# Patient Record
Sex: Male | Born: 1935 | Hispanic: Yes | Marital: Married
Health system: Southern US, Community
[De-identification: ages and names within clinical notes are randomized; demographics above are authoritative.]

---

## 2006-02-15 ENCOUNTER — Emergency Department: Payer: Self-pay | Admitting: Emergency Medicine

## 2007-07-25 ENCOUNTER — Emergency Department: Payer: Self-pay | Admitting: Emergency Medicine

## 2021-03-21 ENCOUNTER — Other Ambulatory Visit: Payer: Self-pay | Admitting: Internal Medicine

## 2021-03-21 DIAGNOSIS — N184 Chronic kidney disease, stage 4 (severe): Secondary | ICD-10-CM

## 2021-03-29 ENCOUNTER — Other Ambulatory Visit: Payer: Self-pay | Admitting: Neurology

## 2021-03-29 DIAGNOSIS — F0150 Vascular dementia without behavioral disturbance: Secondary | ICD-10-CM

## 2021-04-11 ENCOUNTER — Ambulatory Visit
Admission: RE | Admit: 2021-04-11 | Discharge: 2021-04-11 | Disposition: A | Discharge status: Home / Self Care | Payer: Medicare Other | Source: Ambulatory Visit | Attending: Internal Medicine | Admitting: Internal Medicine

## 2021-04-11 ENCOUNTER — Ambulatory Visit
Admission: RE | Admit: 2021-04-11 | Discharge: 2021-04-11 | Disposition: A | Discharge status: Home / Self Care | Payer: Medicare Other | Source: Ambulatory Visit | Attending: Neurology | Admitting: Neurology

## 2021-04-11 ENCOUNTER — Other Ambulatory Visit: Payer: Self-pay

## 2021-04-11 DIAGNOSIS — F0150 Vascular dementia without behavioral disturbance: Secondary | ICD-10-CM | POA: Diagnosis present

## 2021-04-11 DIAGNOSIS — F0280 Dementia in other diseases classified elsewhere without behavioral disturbance: Secondary | ICD-10-CM | POA: Diagnosis present

## 2021-04-11 DIAGNOSIS — N184 Chronic kidney disease, stage 4 (severe): Secondary | ICD-10-CM | POA: Diagnosis present

## 2021-04-11 DIAGNOSIS — G309 Alzheimer's disease, unspecified: Secondary | ICD-10-CM | POA: Insufficient documentation

## 2021-04-12 ENCOUNTER — Ambulatory Visit (INDEPENDENT_AMBULATORY_CARE_PROVIDER_SITE_OTHER): Payer: Medicare Other | Admitting: Dermatology

## 2021-04-12 DIAGNOSIS — L578 Other skin changes due to chronic exposure to nonionizing radiation: Secondary | ICD-10-CM

## 2021-04-12 DIAGNOSIS — D1800 Hemangioma unspecified site: Secondary | ICD-10-CM | POA: Diagnosis not present

## 2021-04-12 DIAGNOSIS — L821 Other seborrheic keratosis: Secondary | ICD-10-CM

## 2021-04-12 DIAGNOSIS — L820 Inflamed seborrheic keratosis: Secondary | ICD-10-CM | POA: Diagnosis not present

## 2021-04-12 NOTE — Progress Notes (Signed)
   New Patient Visit  Subjective  Adrian Michael is a 86 y.o. male who presents for the following: skin lesions (On the neck and back that his PCP Dr. Jens Som noticed and would like checked).  The following portions of the chart were reviewed this encounter and updated as appropriate:   Allergies  Meds  Problems  Med Hx  Surg Hx  Fam Hx     Review of Systems:  No other skin or systemic complaints except as noted in HPI or Assessment and Plan.  Objective  Well appearing patient in no apparent distress; mood and affect are within normal limits.  A focused examination was performed including face, neck, back. Relevant physical exam findings are noted in the Assessment and Plan.  Objective  Back, R neck (2): Erythematous keratotic or waxy stuck-on papule or plaque.   Images      Assessment & Plan  Inflamed seborrheic keratosis (2) Back, R neck  Recommend bx if not improved at follow up appointment.  Destruction of lesion - Back, R neck Complexity: simple   Destruction method: cryotherapy   Informed consent: discussed and consent obtained   Timeout:  patient name, date of birth, surgical site, and procedure verified Lesion destroyed using liquid nitrogen: Yes   Region frozen until ice ball extended beyond lesion: Yes   Outcome: patient tolerated procedure well with no complications   Post-procedure details: wound care instructions given     Actinic Damage - chronic, secondary to cumulative UV radiation exposure/sun exposure over time - diffuse scaly erythematous macules with underlying dyspigmentation - Recommend daily broad spectrum sunscreen SPF 30+ to sun-exposed areas, reapply every 2 hours as needed.  - Recommend staying in the shade or wearing long sleeves, sun glasses (UVA+UVB protection) and wide brim hats (4-inch brim around the entire circumference of the hat). - Call for new or changing lesions.  Seborrheic Keratoses - Stuck-on, waxy, tan-brown papules and/or  plaques  - Benign-appearing - Discussed benign etiology and prognosis. - Observe - Call for any changes  Hemangiomas - Red papules - Discussed benign nature - Observe - Call for any changes  Return in about 6 weeks (around 05/24/2021) for ISK recheck.  Adrian Michael, CMA, am acting as scribe for Sarina Ser, MD .  Documentation: I have reviewed the above documentation for accuracy and completeness, and I agree with the above.  Sarina Ser, MD

## 2021-04-12 NOTE — Patient Instructions (Signed)

## 2021-04-17 ENCOUNTER — Encounter: Payer: Self-pay | Admitting: Dermatology

## 2021-05-23 ENCOUNTER — Ambulatory Visit: Payer: Medicare Other | Admitting: Dermatology

## 2021-05-31 NOTE — Progress Notes (Signed)
 Ref Provider: Dr. Fernande, Ophelia Marinell MOULD, MD PCP: Dr. Fernande, Ophelia Marinell III, MD  Assessment and Plan:   In most patients we give written parts of assessment and plan to patient under Patient Instructions/After Visit Summary. So some parts are directed to patient.  Dear Adrian Michael, It was our pleasure to participate in your care in person. We have typed up brief summary of what we discussed.  1. 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 There are some watches that will monitor heart rate daily as well   - Start Memantine  (Namenda ). Please take 5 mg by mouth at night (Please give 90 days, 3 refills) Namenda  / memantine  is a NMDA receptor antagonist, it works on a neurotransmitter called glutamate, indicated for moderate to severe dementia of Alzheimer's type but can be used for other indications as well. Most common side effects are dizziness, headache, constipation and confusion. It is started as low dose and gradually increased.  We reviewed patient's neuroimaging report and actual images with patient. MRI Brain 04/12/2021 - 1. No acute intracranial abnormality.  2. Moderate atrophy, including medial temporal lobe volume loss  which is nonspecific but can be seen with Alzheimer's disease.  3. Mild for age chronic microvascular ischemic disease.    Reviewed Vit B1, Vit D, folate  2. Low back pain  - Following with Physiatry, status post trigger point injections, ESI injection scheduled    3. Imbalance - ambulatory with walker  Referral to Physical Therapy for balance therapy - not completed, will defer on physical therapy for now as patient will be traveling to [place] in coming months   4. History of possible diabetes mellitus - on Metformin per doctors in [place] where patient was  previously receiving medical care  - No diabetic medications per primary care provider in setting of CKD and dementia   5. Chronic Kidney Disease Stage IV - eGFR 29  - Follows with Nephrology   6. Vitamin D Deficiency - supplementing with Vitamin D Repeat Vitamin D ordered per Dr. Fernande to be collected at next lab visit   Return in about 3 months (around 08/31/2021) for Memory, With Kaitlin Paich, PA-C.  Interim History date 05/31/2021   Adrian Michael is a 86 y.o. male here for treatment and evaluation of Dementia  Adrian Michael last visit was on 03/28/2021  Patient's daughter states patient's memory is stable.  Taking Aricept  5 mg nightly.  Tolerating medication well.  Daughter states she has not noticed much improvement in cognition with addition of Donepezil .   Denies falls or balance issues. Was not contacted by therapy. Would like to   Confusion. Calling girlfriedn repetiviely. No wandering episodes. Daughter managing,. Daughter brought in nursing aide in the mornigns. No wandering episodes. No behavioral changes, aggression, irritability.    Adrian Michael mentioned that he is taking his medications as prescribed.  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 Patient's  daughter began to notice changes in cognition in  2017, his wife noticed in 2000. Onset was gradual. Patient does have hearing and gait  impairment. Patient's memory is significantly hampering daily functioning. Patient has frequent feelings of boredom. Patient is no longer able to maintain conversation. Patient has  difficulty learning new information, like how to use cell phone. Patient continues to have control of bowel and bladder. Patient has behavioral changes of clinging. Patient needs assistance with ambulation, bathing, and he forgets to eat.   Patient is unable to drive or manage finances. Patient does not get confused between day and night. Patient has not had significant head trauma. Patient does not have history of ADD or ADHD. Patient does not drink alcohol or started any new medications. Patient has not had any lab work or brain scans for memory.  Patient lived in [place] since 2010s. He moved in with his daughter in March 2022 as his health has been declining. Since patient has been here, patients daughter has been helping to manage patients finances. Patient does have hearing impairment, but does not wear hearing aids consistently.   SLUMS 03/28/21 - 11/30  MRI Brain 04/12/2021 - 1. No acute intracranial abnormality.  2. Moderate atrophy, including medial temporal lobe volume loss  which is nonspecific but can be seen with Alzheimer's disease.  3. Mild for age chronic microvascular ischemic disease.    Medications: Aricept , namenda   Low back pain + component of sciatica  Follows with Physiatry Therapies: ESI, TPI   Imbalance - ambulatory with walker, referred to physical therapy (not completed as of 05/2021)  History of possible diabetes mellitus - on Metformin per doctors in [place] where patient was previously receiving medical care - HgA1c 03/13/21 - 5.5, no diabetic medications per primary care provider in setting of CKD and dementia  Chronic Kidney Disease Stage IV - eGFR 29: Follows with Nephrology Renal US  04/11/2021 - No acute findings. No hydronephrosis. Findings consistent with medical renal disease, more notable on the right where the overall renal size is decreased with diffuse renal cortical thinning and contour lobulation. Both kidneys show increased parenchymal echogenicity, greater  on the right. Small bilateral renal cysts.    Vitamin D Deficiency - supplementing with Vitamin D  Physical Exam   Vitals Vitals:   05/31/21 1333  BP: 137/57  Pulse: 60  Weight: 80.3 kg (177 lb)  Height: 170.2 cm (5' 7)  PainSc: 0-No pain   Body mass index is 27.72 kg/m.  (Some of the exam changes noted are from previous clinical observations)  General Exam Patient looks appropriate of age, well built, nourished and appropriately groomed.   Cardiovascular Exam: S1, S2 heart sounds present Carotid exam revealed no bruit Lung exam was clear to auscultation In wheelchair (05/31/2021)   Neurological Exam     Alert,     Oriented to time, place, person     Attention span and concentration seemed appropriate     Language seemed intact (naming, spontaneous speech, comprehension)     Pupils were reactive to light, extra-ocular movements are normal     Visible parts of face is symmetric     Hearing is equal bilaterally Palate, uvula and tongue movement and other oral inspection was done when necessary.      -     Muscle strength in all extremities seemed normal on observation.     Deep tendon reflexes were symmetric     Sensations were intact to light touch in all extremities  Medications: Current Outpatient Medications on File Prior to Visit  Medication Sig Dispense Refill  . allopurinoL  (ZYLOPRIM ) 100 MG tablet Take 1 tablet (100 mg total) by mouth once daily 90 tablet 1  . amLODIPine  (NORVASC ) 5 MG tablet Takes 2 in the morning and 2 at night 360 tablet 1  . cholecalciferol, vitamin D3, (VITAMIN D3) 125 mcg (5,000 unit) tablet Take 5,000 Units by mouth once daily    . donepeziL  (ARICEPT ) 5 MG tablet Take 1 tablet (5 mg total) by mouth nightly 30 tablet 5  . ginkgo biloba 40 mg Tab Take 600 mg by mouth once daily    . glucosamine/chondr su A sod (OSTEO BI-FLEX ORAL) Take by mouth once daily    . multivit-min/FA/lycopen/lutein (CENTRUM SILVER MEN ORAL) Take by mouth once  daily    . tamsulosin  (FLOMAX ) 0.4 mg capsule Take 1 capsule (0.4 mg total) by mouth once daily Take 30 minutes after same meal each day. 90 capsule 1  . UNABLE TO FIND Focus Factor Takes by mouth daily    . UNABLE TO FIND cbd oil 5% 1 drop orally by mouth daily     No current facility-administered medications on file prior to visit.    Past Medical History:  Past Medical History:  Diagnosis Date  . Anemia in chronic kidney disease 03/13/2021  . Chronic kidney disease   . Chronic kidney disease, stage III (moderate) (CMS-HCC) 03/13/2021  . Dementia (CMS-HCC)   . Esophageal reflux 03/13/2021  . Essential hypertension 03/13/2021  . Hyperlipidemia 03/13/2021  . Hypertension   . Lumbar disc disease 03/13/2021  . Secondary hyperparathyroidism, renal (CMS-HCC) 03/13/2021  . Type 2 diabetes mellitus with complication (CMS-HCC) 03/13/2021    Past Surgical History:  Past Surgical History:  Procedure Laterality Date  . APPENDECTOMY    . JOINT REPLACEMENT    . KNEE ARTHROSCOPY     Family History:  Family History  Problem Relation Age of Onset  . Alcohol abuse Brother   . Stroke Daughter   . Diabetes Son   . Hyperlipidemia (Elevated cholesterol) Son    Social History:  Social History   Socioeconomic History  . Marital status: Widowed  Tobacco Use  . Smoking status: Never Smoker  . Smokeless tobacco: Never Used  Vaping Use  . Vaping Use: Never used  Substance and Sexual Activity  . Alcohol use: Yes    Alcohol/week: 0.8 standard drinks    Types: 1 Standard drinks or equivalent per week  . Drug use: No  . Sexual activity: Yes    Partners: Female   Allergies: No Known Allergies  Review of Systems: 13 system ROS form was given to the patient to complete and I have reviewed it. The form was sent for scan to the patient's EHR. Pertinent positives and negatives are documented in the HPI, and all other systems are negative.  I personally evaluated this patient in conjunction with Dr.  Jannett Fairly, MD.  KAITLIN PAICH, PA  I personally saw the patient and performed a substantive portion of this encounter in conjunction with the Kaitlin Paich, PA-C. This note is partially written by Asberry Sequin, CMA, and Dr. Jannett Fairly has reviewed, edited, and added to the note to reflect his best personal medical judgment. Payor: MEDICARE / Plan: MEDICARE A AND B / Product Type: Medicare /  I apologize for any typographical errors that were not detected and corrected.     Dr. Jannett MARLA Fairly, MD Board Certified in Neurology and Clinical  Neurophysiology [facility]

## 2021-06-01 NOTE — Progress Notes (Signed)
 [facility] - Physiatry  PROCEDURE: 1. Bilateral S1 transforaminal epidural injections under fluoroscopic guidance. 2. Use of fluoroscopic guidance was required to ensure adequate delivery of medication into the epidural space and for proper needle placement. 3. The patient was monitored with continuous pulse oximetry throughout the procedure.   DIAGNOSIS/INDICATION FOR PROCEDURE: The patient is a pleasant 86 year old gentleman followed by [facility] for acute on chronic bilateral low back pain with radiation into the bilateral buttock.  Clinically has symptoms consistent with lumbosacral radiculitis.  Element of facet mediated pain cannot be ruled out.  MRI of the lumbar spine reviewed on films from [place] report unavailable.  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  His pain is rated 7/10  Procedures:  06/01/2021: Bilateral S1 transforaminal ESI   IMPRESSION/RESULTS: Patient tolerated the procedure well without any complications. We used a 3.5 inch needle and 2% lidocaine.  He will follow-up with [facility] in 3 weeks.   INFORMED CONSENT: The patient understood the potential risks and benefits of the procedure, which were explained to the patient prior to the procedure.  The patient read and signed the consent stating complete understanding of the information, and wished to proceed with the procedure, there were no barriers to understanding.  Ample time was given for any questions to be answered prior to the procedure. The risks of the procedure were explained including, but not limited to the risk of bleeding and/or infection into the spinal area, intervertebral discs, or epidural space; nerve injury; nerve irritation; reaction to medications; etc.  The patient denied any history of bleeding disorders, allergies to medications used or other medical contraindications to the  procedure.  No promises were given to any expected outcome.    PROCEDURE: A time out was performed by physician and assistant.  Correct patient, procedure, plan of care, site, position, and equipment were verified.  Identical procedural technique was used for the bilateral S1 transforaminal epidural steroid injections.  The patient was sterilely prepped with a triple scrub of betadine solution and draped in the prone position.  Careful attention was paid to aseptic technique throughout the procedure.  The bilateral S1 neuroforamina were identified under fluoroscopic guidance.  The overlying skin and subcutaneous tissues were anesthetized with approximately 2-3 cc of 1% Xylocaine.  A 25 gauge spinal needle was inserted down through the foramina and into the epidural space.  Approximately 1-2 cc of Omnipaque-240 mgI/mL contrast was infiltrated under real-time fluoroscopy demonstrating satisfactory spread along the S1 nerves and tracking into the epidural space without vascular uptake.  Spot films were taken.  A solution containing 1.0 cc of dexamethasone sodium phosphate 10 mg/cc and 1.0 cc of 2% preservative-free lidocaine was equally divided and slowly infiltrated around the nerve roots and into the epidural space under real-time fluoroscopy for each S1 transforaminal epidural steroid injection.  There was good contrast washout for both epidural injections.  The needle was removed.  No aspiration was noted during the procedure despite frequent, intermittent attempts at aspiration.  There were no complications.   DISCHARGE SUMMARY: The patient was monitored post-injection for 30 minutes in the recovery area and remained stable without evidence of complications.  The patient was discharged with discharge instructions in stable condition. Patient was instructed to contact us  with any problems.  Procedure fluoro time: 55 seconds. Fluoroscopy dose: 17.51 mGy.  This is below the dose threshold #1 (5000  mGy).    BENJAMIN CHARLES CHASNIS, DO

## 2021-11-24 IMAGING — US US RENAL
1 series · 14 of 25 positions shown · non-contrast
Comparison: None.

CLINICAL DATA: Stage 4 chronic kidney disease.

EXAM:
RENAL / URINARY TRACT ULTRASOUND COMPLETE

[Series 1: us renal · 14 of 41 slices shown]
[im 1/41]
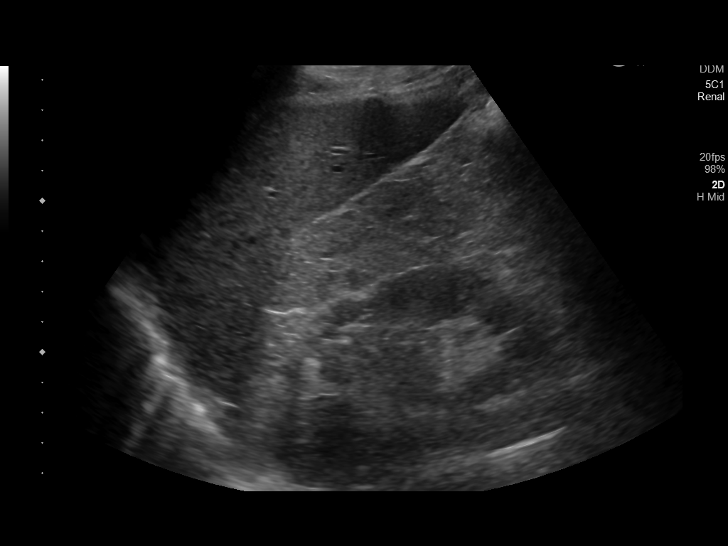
[im 4/41]
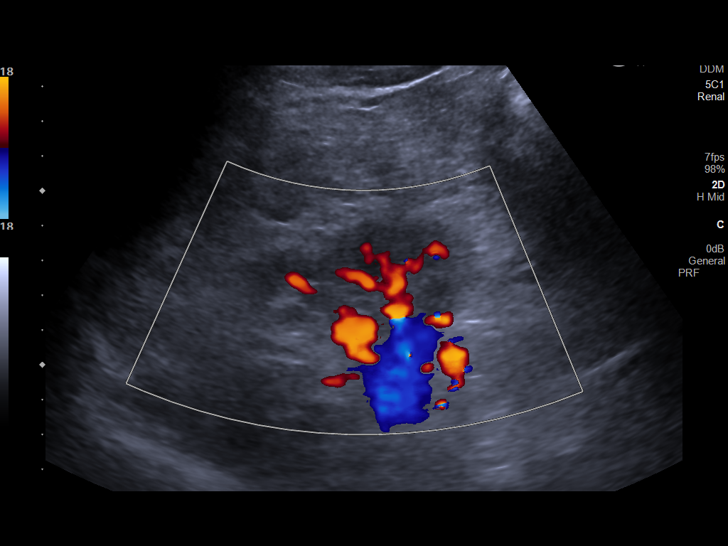
[im 7/41]
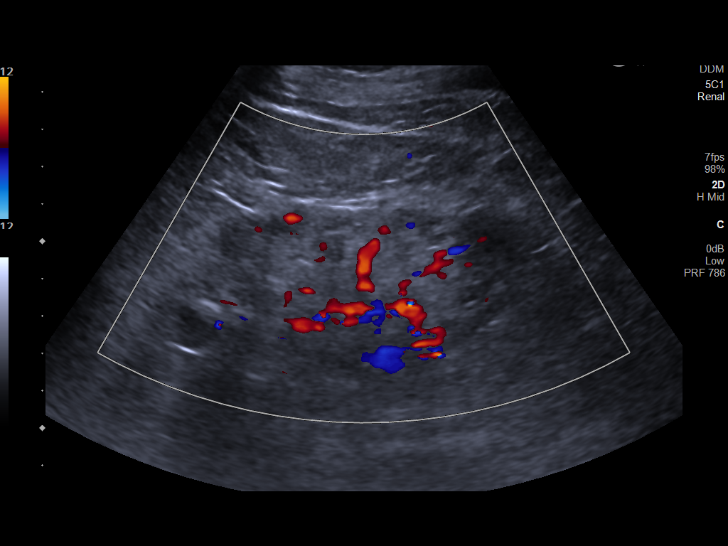
[im 11/41]
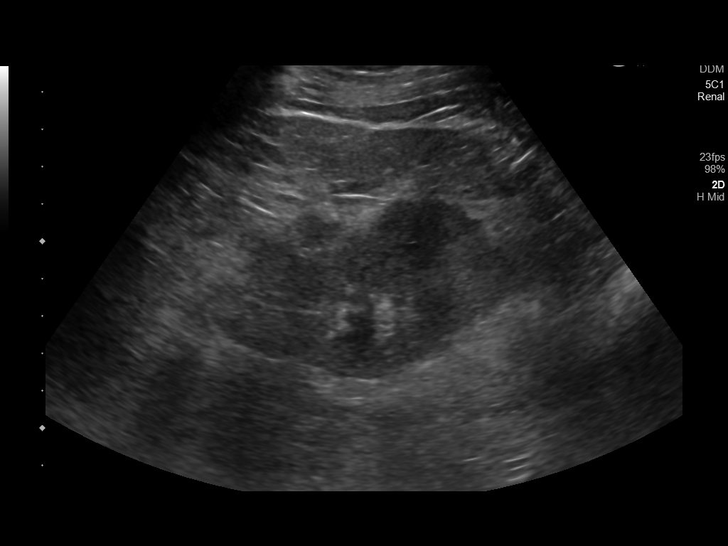
[im 14/41]
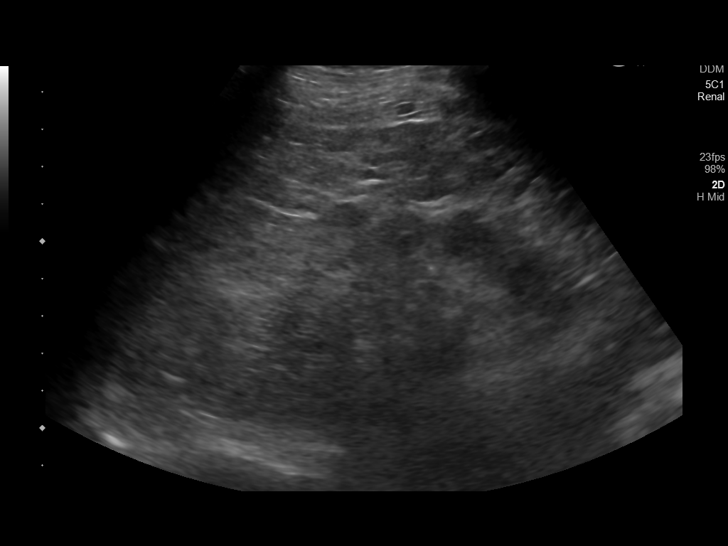
[im 16/41]
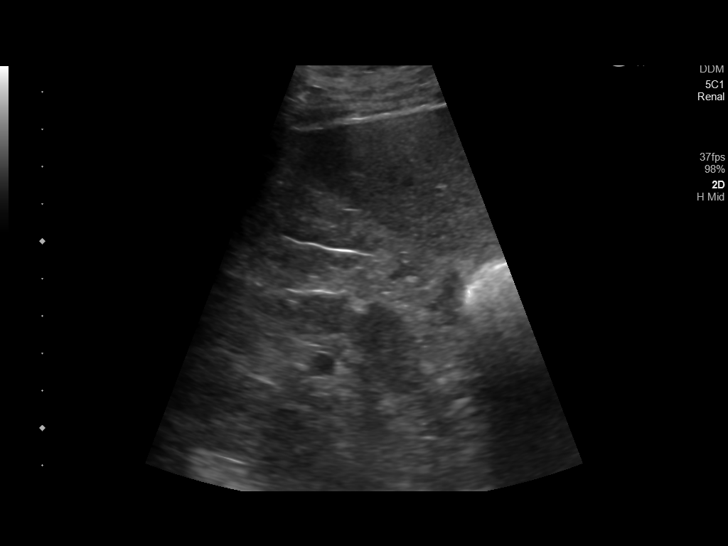
[im 19/41]
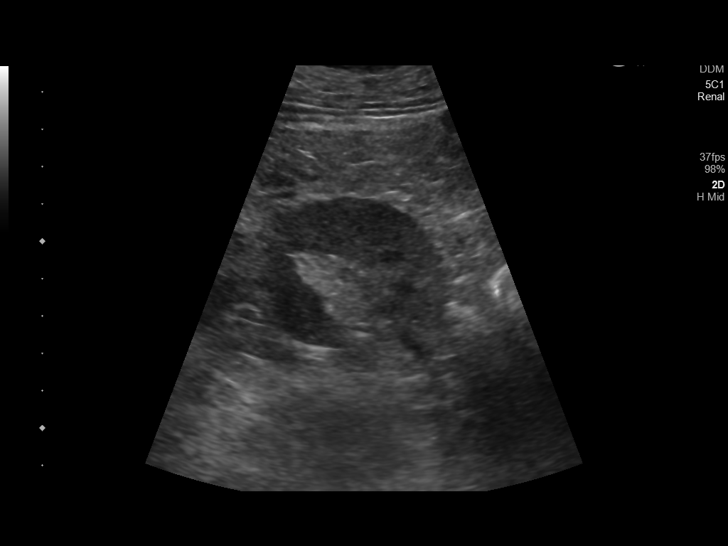
[im 22/41]
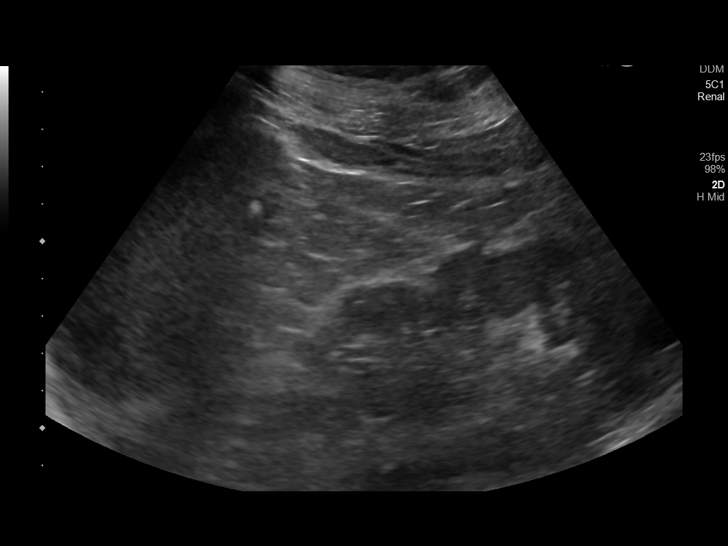
[im 26/41]
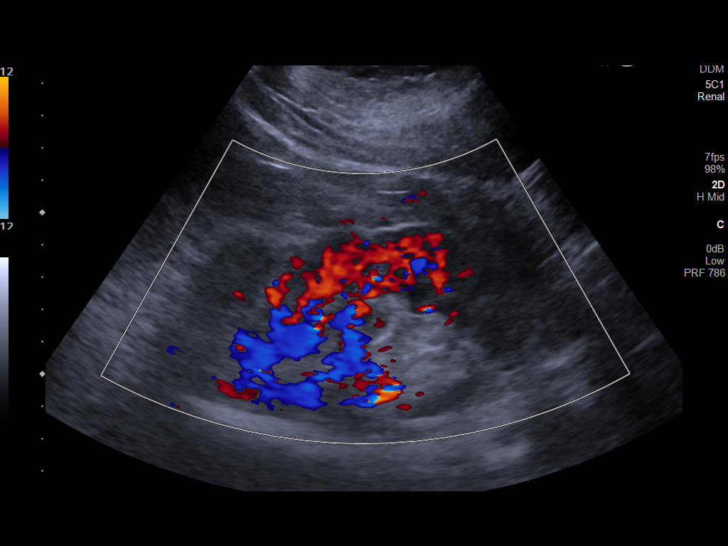
[im 27/41]
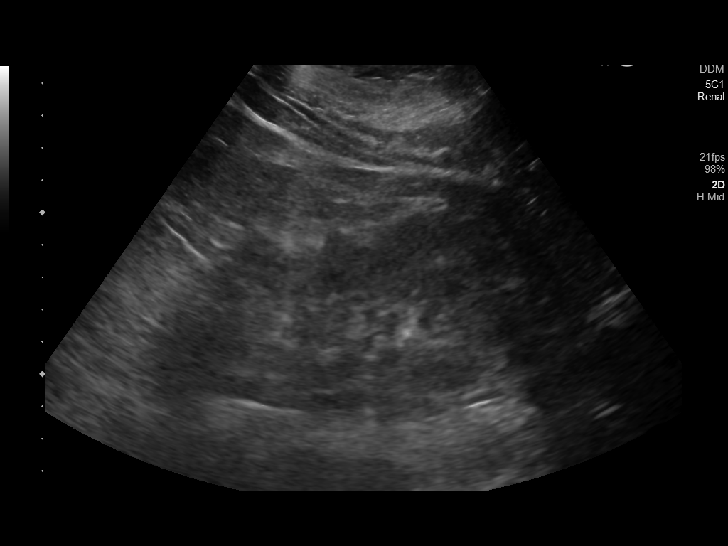
[im 31/41]
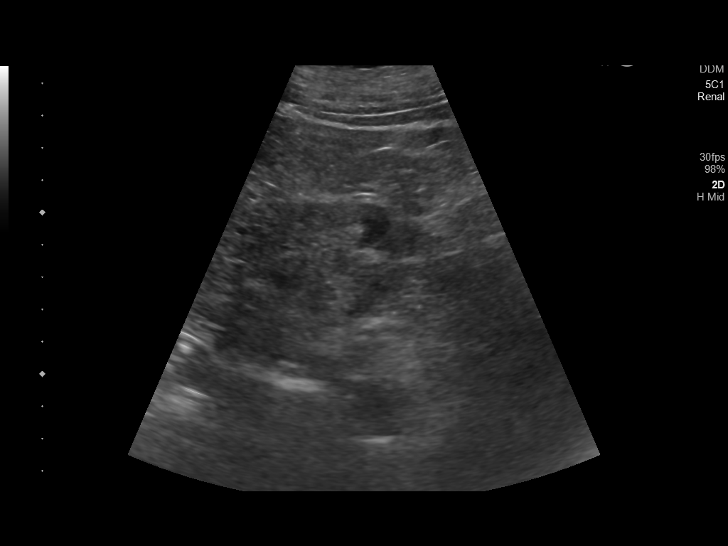
[im 34/41]
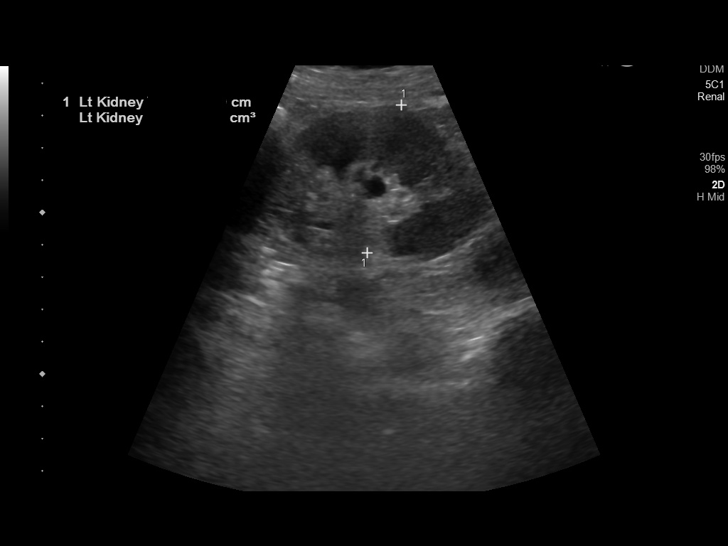
[im 37/41]
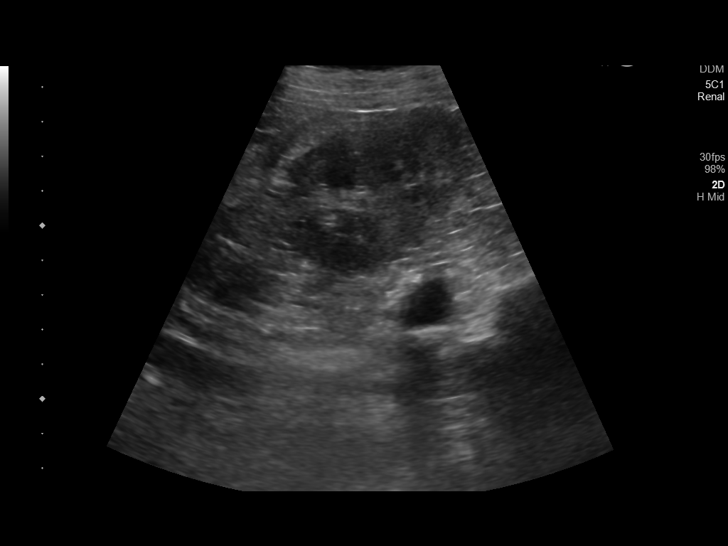
[im 41/41]
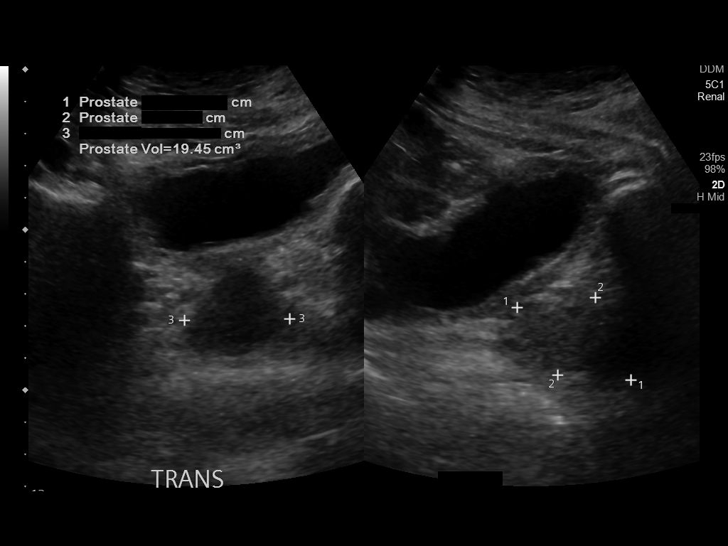

[14 of 25 positions shown; findings below may reference images not displayed]

FINDINGS: Right Kidney:

Renal measurements: 8.6 x 4.2 x 5.0 cm = volume: 94.3 mL. Increased
parenchymal echogenicity. Cortical thinning and lobulated contour.
Upper pole cyst measuring 1.1 x 1.0 x 1.0 cm. No other masses, no
stones and no hydronephrosis.

Left Kidney:

Renal measurements: 11.9 x 6.0 x 4.7 cm = volume: 177.0 mL. Mild
upper pole renal cortical thinning with contour lobulation. Mild
increased parenchymal echogenicity less notable than that on the
right. Small cyst arises from the upper pole, 1.5 x 0.8 x 1.3 cm. No
other masses, no stones and no hydronephrosis.

Bladder:

Appears normal for degree of bladder distention.

Other:

Prostate volume, 19.5 mL.
IMPRESSION: 1. No acute findings.  No hydronephrosis.
2. Findings consistent with medical renal disease, more notable on
the right where the overall renal size is decreased with diffuse
renal cortical thinning and contour lobulation. Both kidneys show
increased parenchymal echogenicity, greater on the right.
3. Small bilateral renal cysts.

## 2021-11-24 IMAGING — MR MR HEAD W/O CM
11 series · 48 of 48 positions shown · non-contrast
Comparison: None.

CLINICAL DATA: Increasing confusion.

EXAM:
MRI HEAD WITHOUT CONTRAST
TECHNIQUE: Multiplanar, multiecho pulse sequences of the brain and surrounding
structures were obtained without intravenous contrast.

[Series 5: ax dwi_tracew · axial · 3.0mm · 0.65mm/px · z∈[-69,+82]mm · 4 of 48 slices shown]
[im 1/48]
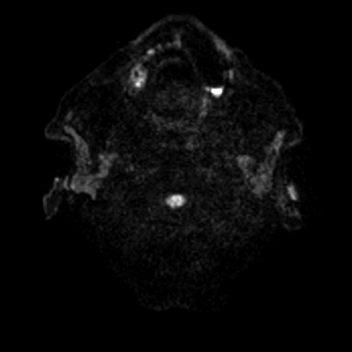
[im 16/48]
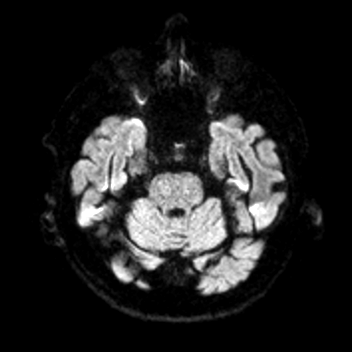
[im 32/48]
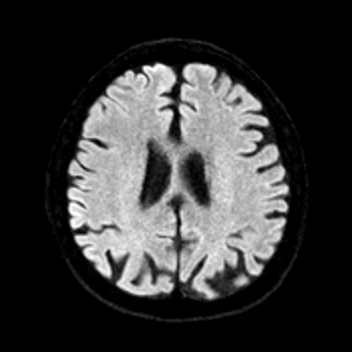
[im 48/48]
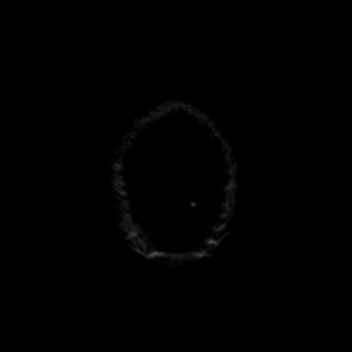

[Series 6: ax dwi_adc · axial · 3.0mm · 0.65mm/px · z∈[-69,+82]mm · 4 of 48 slices shown]
[im 1/48]
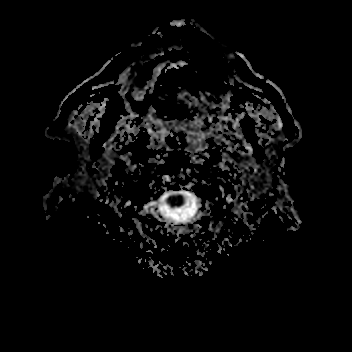
[im 16/48]
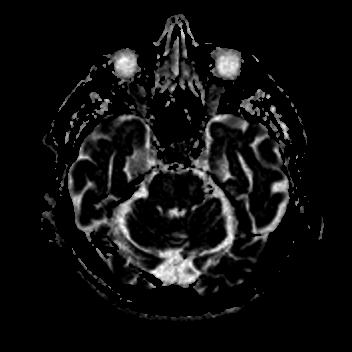
[im 32/48]
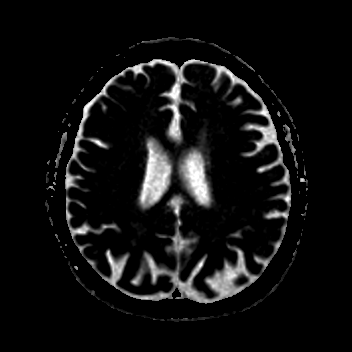
[im 48/48]
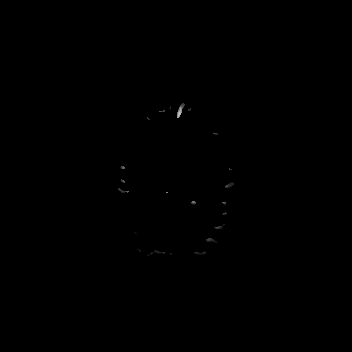

[Series 7: cor dwi_tracew · coronal · 5.0mm · 0.65mm/px · 3 of 40 slices shown]
[im 1/40]
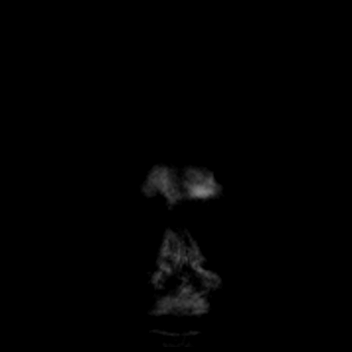
[im 20/40]
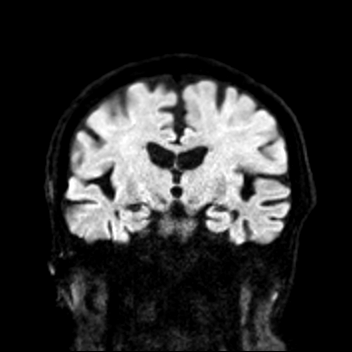
[im 40/40]
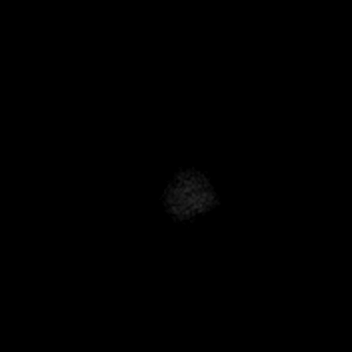

[Series 8: cor dwi_adc · coronal · 5.0mm · 0.65mm/px · 3 of 39 slices shown]
[im 1/39]
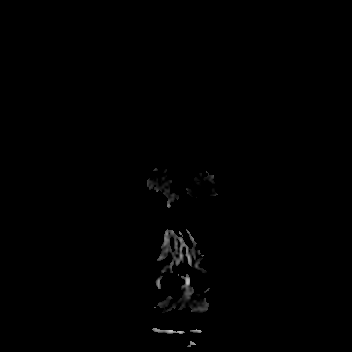
[im 20/39]
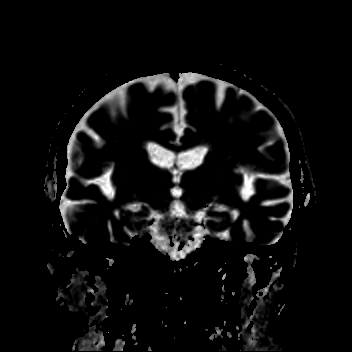
[im 39/39]
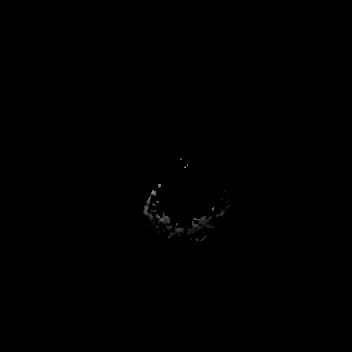

[Series 9: T1 · sagittal · 5.0mm · 0.62mm/px · 2 of 25 slices shown (1 of 2)]
[im 1/25]
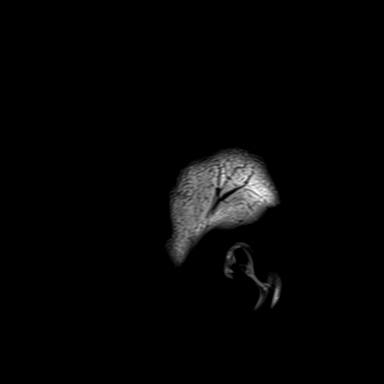
[im 25/25]
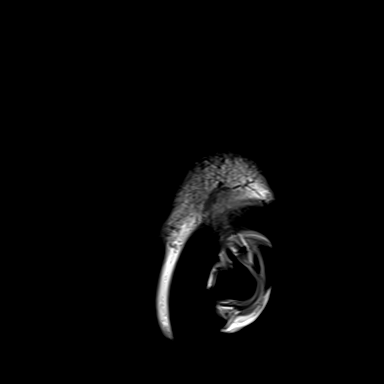

[Series 10: T2 · axial · 5.0mm · 0.53mm/px · z∈[-62,+79]mm · 2 of 25 slices shown (1 of 2)]
[im 1/25]
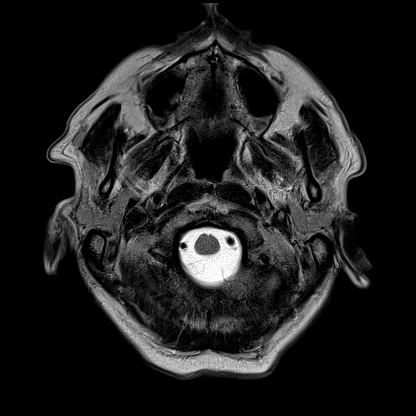
[im 25/25]
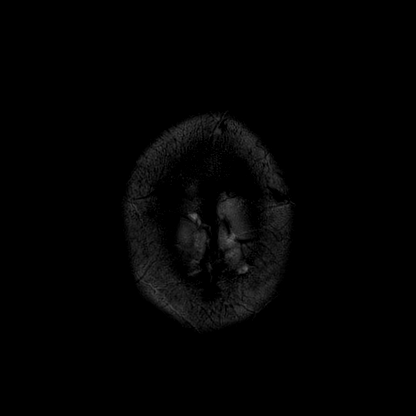

[Series 12: pha_images · axial · 3.0mm · 0.90mm/px · z∈[-76,+91]mm · 5 of 57 slices shown]
[im 1/57]
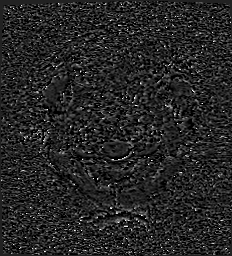
[im 15/57]
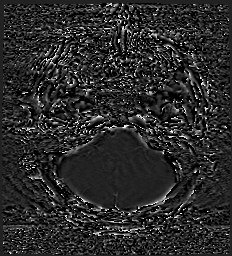
[im 29/57]
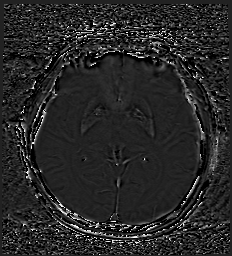
[im 43/57]
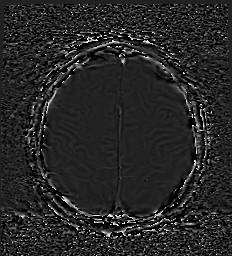
[im 57/57]
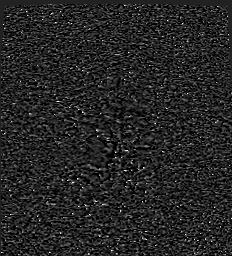

[Series 13: swi_images · axial · 3.0mm · 0.90mm/px · z∈[-79,+94]mm · 5 of 60 slices shown]
[im 1/60]
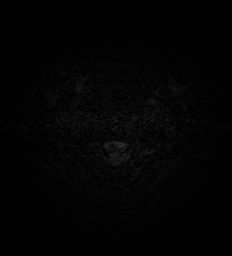
[im 15/60]
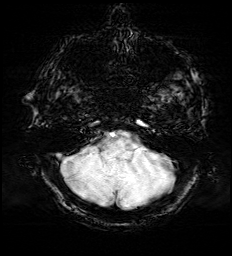
[im 30/60]
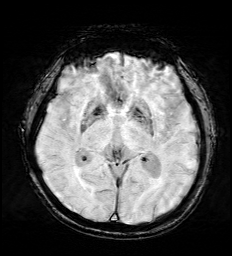
[im 45/60]
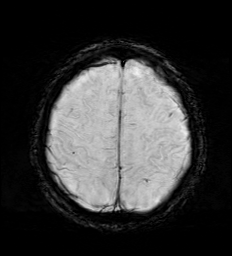
[im 60/60]
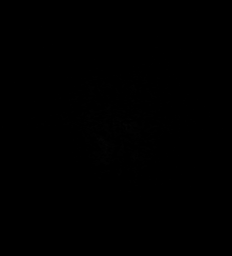

[Series 15: FLAIR · axial · 3.0mm · 0.53mm/px · z∈[-71,+88]mm · 4 of 55 slices shown]
[im 1/55]
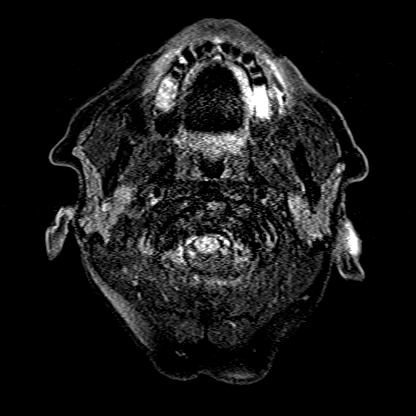
[im 19/55]
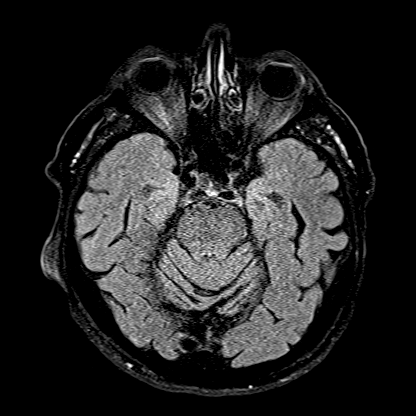
[im 37/55]
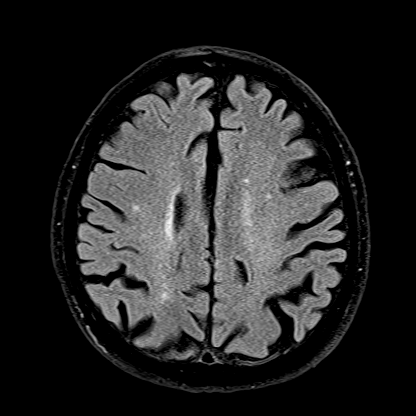
[im 55/55]
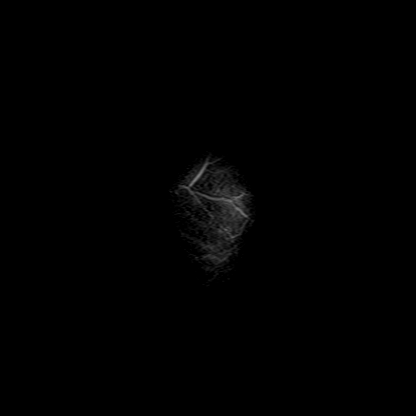

[Series 16: T1 · axial · 1.0mm · 0.98mm/px · z∈[-75,+97]mm · 14 of 176 slices shown (2 of 2)]
[im 1/176]
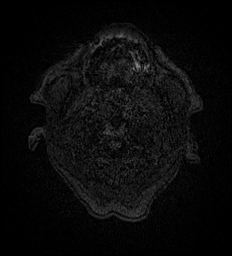
[im 14/176]
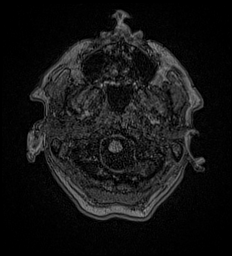
[im 27/176]
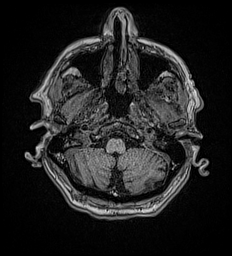
[im 41/176]
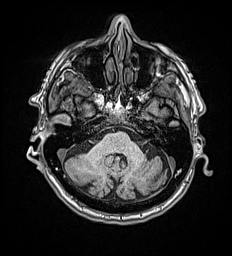
[im 54/176]
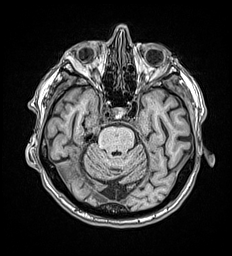
[im 68/176]
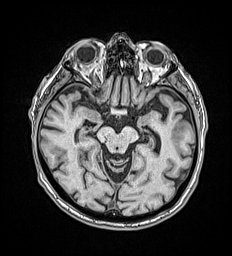
[im 81/176]
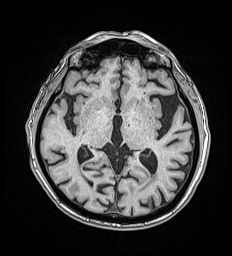
[im 95/176]
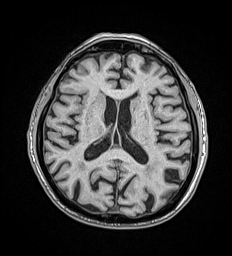
[im 108/176]
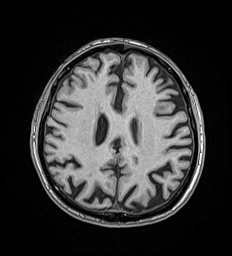
[im 122/176]
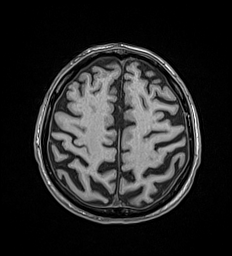
[im 135/176]
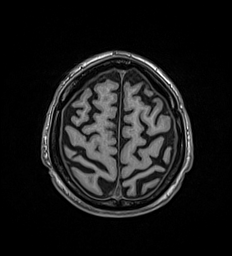
[im 149/176]
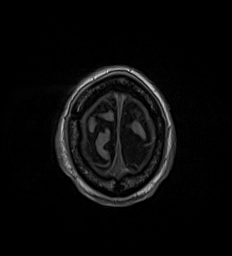
[im 162/176]
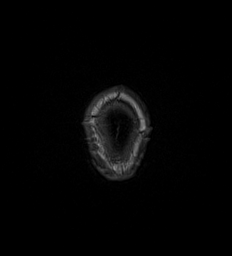
[im 176/176]
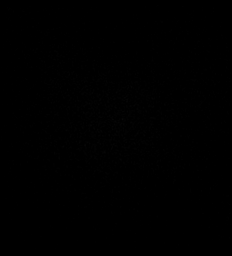

[Series 17: T2 · coronal · 5.0mm · 0.57mm/px · 2 of 29 slices shown (2 of 2)]
[im 1/29]
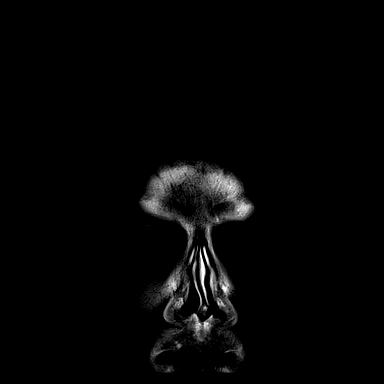
[im 29/29]
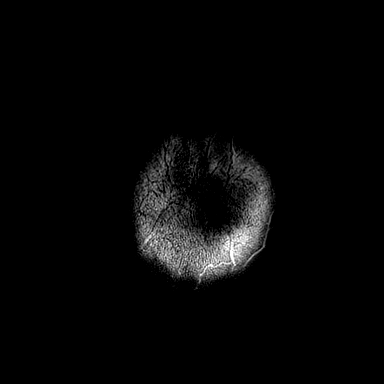

[48 of 48 positions shown; findings below may reference images not displayed]

FINDINGS: Brain: No acute infarction, hemorrhage, hydrocephalus, extra-axial
collection or mass lesion. Moderate cerebral atrophy, including
mesial temporal lobe volume loss and ex vacuo ventricular dilation.
Mild for age scattered T2/FLAIR hyperintensities within the white
matter, compatible with chronic microvascular ischemic disease.

Vascular: Major arterial flow voids are maintained at the skull
base.

Skull and upper cervical spine: Normal marrow signal.

Sinuses/Orbits: Clear sinuses.  Unremarkable orbits.

Other: No mastoid effusions.
IMPRESSION: 1. No acute intracranial abnormality.
2. Moderate atrophy, including medial temporal lobe volume loss
which is nonspecific but can be seen with Alzheimer's disease.
3. Mild for age chronic microvascular ischemic disease.

## 2021-12-26 NOTE — Telephone Encounter (Signed)
Lvm for pt to return call to schedule f/u appt.

## 2023-05-07 ENCOUNTER — Ambulatory Visit: Payer: Medicare Other

## 2023-05-14 ENCOUNTER — Ambulatory Visit: Payer: Medicare Other

## 2023-05-21 ENCOUNTER — Ambulatory Visit: Payer: Medicare Other

## 2023-05-28 ENCOUNTER — Ambulatory Visit: Payer: Medicare Other

## 2023-06-25 ENCOUNTER — Ambulatory Visit: Payer: Medicare Other

## 2024-08-29 ENCOUNTER — Emergency Department

## 2024-08-29 ENCOUNTER — Inpatient Hospital Stay
Admission: EM | Admit: 2024-08-29 | Discharge: 2024-09-06 | DRG: 193 | Disposition: A | Discharge status: Hospice | Attending: Internal Medicine | Admitting: Internal Medicine

## 2024-08-29 ENCOUNTER — Other Ambulatory Visit: Payer: Self-pay

## 2024-08-29 DIAGNOSIS — I499 Cardiac arrhythmia, unspecified: Secondary | ICD-10-CM

## 2024-08-29 DIAGNOSIS — E782 Mixed hyperlipidemia: Secondary | ICD-10-CM | POA: Diagnosis present

## 2024-08-29 DIAGNOSIS — Z515 Encounter for palliative care: Secondary | ICD-10-CM

## 2024-08-29 DIAGNOSIS — E1151 Type 2 diabetes mellitus with diabetic peripheral angiopathy without gangrene: Secondary | ICD-10-CM | POA: Diagnosis present

## 2024-08-29 DIAGNOSIS — G309 Alzheimer's disease, unspecified: Secondary | ICD-10-CM | POA: Diagnosis not present

## 2024-08-29 DIAGNOSIS — N179 Acute kidney failure, unspecified: Secondary | ICD-10-CM | POA: Diagnosis present

## 2024-08-29 DIAGNOSIS — I479 Paroxysmal tachycardia, unspecified: Secondary | ICD-10-CM | POA: Diagnosis not present

## 2024-08-29 DIAGNOSIS — I493 Ventricular premature depolarization: Secondary | ICD-10-CM | POA: Diagnosis not present

## 2024-08-29 DIAGNOSIS — J1001 Influenza due to other identified influenza virus with the same other identified influenza virus pneumonia: Secondary | ICD-10-CM | POA: Diagnosis present

## 2024-08-29 DIAGNOSIS — N189 Chronic kidney disease, unspecified: Secondary | ICD-10-CM | POA: Diagnosis not present

## 2024-08-29 DIAGNOSIS — I4891 Unspecified atrial fibrillation: Secondary | ICD-10-CM | POA: Diagnosis not present

## 2024-08-29 DIAGNOSIS — R7989 Other specified abnormal findings of blood chemistry: Secondary | ICD-10-CM

## 2024-08-29 DIAGNOSIS — E870 Hyperosmolality and hypernatremia: Secondary | ICD-10-CM | POA: Diagnosis not present

## 2024-08-29 DIAGNOSIS — R64 Cachexia: Secondary | ICD-10-CM | POA: Diagnosis present

## 2024-08-29 DIAGNOSIS — E871 Hypo-osmolality and hyponatremia: Secondary | ICD-10-CM | POA: Diagnosis present

## 2024-08-29 DIAGNOSIS — F0150 Vascular dementia without behavioral disturbance: Secondary | ICD-10-CM | POA: Diagnosis present

## 2024-08-29 DIAGNOSIS — I2489 Other forms of acute ischemic heart disease: Secondary | ICD-10-CM | POA: Diagnosis present

## 2024-08-29 DIAGNOSIS — J9601 Acute respiratory failure with hypoxia: Principal | ICD-10-CM | POA: Diagnosis present

## 2024-08-29 DIAGNOSIS — L89153 Pressure ulcer of sacral region, stage 3: Secondary | ICD-10-CM | POA: Diagnosis present

## 2024-08-29 DIAGNOSIS — E875 Hyperkalemia: Secondary | ICD-10-CM | POA: Diagnosis present

## 2024-08-29 DIAGNOSIS — E1122 Type 2 diabetes mellitus with diabetic chronic kidney disease: Secondary | ICD-10-CM | POA: Diagnosis present

## 2024-08-29 DIAGNOSIS — Z1152 Encounter for screening for COVID-19: Secondary | ICD-10-CM | POA: Diagnosis not present

## 2024-08-29 DIAGNOSIS — D649 Anemia, unspecified: Secondary | ICD-10-CM

## 2024-08-29 DIAGNOSIS — E43 Unspecified severe protein-calorie malnutrition: Secondary | ICD-10-CM | POA: Diagnosis present

## 2024-08-29 DIAGNOSIS — E1165 Type 2 diabetes mellitus with hyperglycemia: Secondary | ICD-10-CM | POA: Diagnosis present

## 2024-08-29 DIAGNOSIS — N400 Enlarged prostate without lower urinary tract symptoms: Secondary | ICD-10-CM | POA: Diagnosis present

## 2024-08-29 DIAGNOSIS — F0280 Dementia in other diseases classified elsewhere without behavioral disturbance: Secondary | ICD-10-CM | POA: Diagnosis present

## 2024-08-29 DIAGNOSIS — D7589 Other specified diseases of blood and blood-forming organs: Secondary | ICD-10-CM | POA: Diagnosis present

## 2024-08-29 DIAGNOSIS — I10 Essential (primary) hypertension: Secondary | ICD-10-CM | POA: Diagnosis not present

## 2024-08-29 DIAGNOSIS — I480 Paroxysmal atrial fibrillation: Secondary | ICD-10-CM | POA: Diagnosis present

## 2024-08-29 DIAGNOSIS — Z794 Long term (current) use of insulin: Secondary | ICD-10-CM

## 2024-08-29 DIAGNOSIS — E8721 Acute metabolic acidosis: Secondary | ICD-10-CM | POA: Diagnosis present

## 2024-08-29 DIAGNOSIS — Z6821 Body mass index (BMI) 21.0-21.9, adult: Secondary | ICD-10-CM

## 2024-08-29 DIAGNOSIS — Z79890 Hormone replacement therapy (postmenopausal): Secondary | ICD-10-CM

## 2024-08-29 DIAGNOSIS — I4892 Unspecified atrial flutter: Secondary | ICD-10-CM | POA: Diagnosis present

## 2024-08-29 DIAGNOSIS — I471 Supraventricular tachycardia, unspecified: Secondary | ICD-10-CM | POA: Diagnosis present

## 2024-08-29 DIAGNOSIS — J101 Influenza due to other identified influenza virus with other respiratory manifestations: Secondary | ICD-10-CM | POA: Diagnosis not present

## 2024-08-29 DIAGNOSIS — I959 Hypotension, unspecified: Secondary | ICD-10-CM | POA: Diagnosis present

## 2024-08-29 DIAGNOSIS — L8990 Pressure ulcer of unspecified site, unspecified stage: Secondary | ICD-10-CM

## 2024-08-29 DIAGNOSIS — I129 Hypertensive chronic kidney disease with stage 1 through stage 4 chronic kidney disease, or unspecified chronic kidney disease: Secondary | ICD-10-CM | POA: Diagnosis present

## 2024-08-29 DIAGNOSIS — N184 Chronic kidney disease, stage 4 (severe): Secondary | ICD-10-CM | POA: Diagnosis present

## 2024-08-29 DIAGNOSIS — E860 Dehydration: Secondary | ICD-10-CM | POA: Diagnosis present

## 2024-08-29 DIAGNOSIS — R0602 Shortness of breath: Secondary | ICD-10-CM | POA: Diagnosis not present

## 2024-08-29 DIAGNOSIS — D631 Anemia in chronic kidney disease: Secondary | ICD-10-CM | POA: Diagnosis present

## 2024-08-29 DIAGNOSIS — R000 Tachycardia, unspecified: Secondary | ICD-10-CM | POA: Diagnosis not present

## 2024-08-29 DIAGNOSIS — Z66 Do not resuscitate: Secondary | ICD-10-CM | POA: Diagnosis present

## 2024-08-29 DIAGNOSIS — E039 Hypothyroidism, unspecified: Secondary | ICD-10-CM | POA: Diagnosis present

## 2024-08-29 DIAGNOSIS — Z7984 Long term (current) use of oral hypoglycemic drugs: Secondary | ICD-10-CM

## 2024-08-29 DIAGNOSIS — J1100 Influenza due to unidentified influenza virus with unspecified type of pneumonia: Secondary | ICD-10-CM | POA: Diagnosis present

## 2024-08-29 DIAGNOSIS — Z7401 Bed confinement status: Secondary | ICD-10-CM

## 2024-08-29 DIAGNOSIS — R008 Other abnormalities of heart beat: Secondary | ICD-10-CM | POA: Diagnosis present

## 2024-08-29 DIAGNOSIS — K219 Gastro-esophageal reflux disease without esophagitis: Secondary | ICD-10-CM | POA: Diagnosis present

## 2024-08-29 DIAGNOSIS — R54 Age-related physical debility: Secondary | ICD-10-CM | POA: Diagnosis present

## 2024-08-29 DIAGNOSIS — M109 Gout, unspecified: Secondary | ICD-10-CM | POA: Diagnosis present

## 2024-08-29 DIAGNOSIS — R627 Adult failure to thrive: Secondary | ICD-10-CM | POA: Diagnosis present

## 2024-08-29 DIAGNOSIS — I2510 Atherosclerotic heart disease of native coronary artery without angina pectoris: Secondary | ICD-10-CM | POA: Diagnosis present

## 2024-08-29 DIAGNOSIS — Z79899 Other long term (current) drug therapy: Secondary | ICD-10-CM

## 2024-08-29 DIAGNOSIS — D509 Iron deficiency anemia, unspecified: Secondary | ICD-10-CM | POA: Diagnosis present

## 2024-08-29 LAB — URINALYSIS, ROUTINE W REFLEX MICROSCOPIC
Bacteria, UA: NONE SEEN
Bilirubin Urine: NEGATIVE
Glucose, UA: NEGATIVE mg/dL
Hgb urine dipstick: NEGATIVE
Ketones, ur: NEGATIVE mg/dL
Leukocytes,Ua: NEGATIVE
Nitrite: NEGATIVE
Protein, ur: 30 mg/dL — AB
Specific Gravity, Urine: 1.014 (ref 1.005–1.030)
Squamous Epithelial / HPF: 0 /HPF (ref 0–5)
pH: 5 (ref 5.0–8.0)

## 2024-08-29 LAB — CBC WITH DIFFERENTIAL/PLATELET
Abs Immature Granulocytes: 0.01 K/uL (ref 0.00–0.07)
Basophils Absolute: 0 K/uL (ref 0.0–0.1)
Basophils Relative: 0 %
Eosinophils Absolute: 0 K/uL (ref 0.0–0.5)
Eosinophils Relative: 0 %
HCT: 31.1 % — ABNORMAL LOW (ref 39.0–52.0)
Hemoglobin: 9.8 g/dL — ABNORMAL LOW (ref 13.0–17.0)
Immature Granulocytes: 0 %
Lymphocytes Relative: 22 %
Lymphs Abs: 1.3 K/uL (ref 0.7–4.0)
MCH: 33.8 pg (ref 26.0–34.0)
MCHC: 31.5 g/dL (ref 30.0–36.0)
MCV: 107.2 fL — ABNORMAL HIGH (ref 80.0–100.0)
Monocytes Absolute: 0.3 K/uL (ref 0.1–1.0)
Monocytes Relative: 6 %
Neutro Abs: 4 K/uL (ref 1.7–7.7)
Neutrophils Relative %: 72 %
Platelets: 220 K/uL (ref 150–400)
RBC: 2.9 MIL/uL — ABNORMAL LOW (ref 4.22–5.81)
RDW: 14.6 % (ref 11.5–15.5)
Smear Review: NORMAL
WBC: 5.6 K/uL (ref 4.0–10.5)
nRBC: 0 % (ref 0.0–0.2)

## 2024-08-29 LAB — BLOOD GAS, ARTERIAL
Acid-base deficit: 11 mmol/L — ABNORMAL HIGH (ref 0.0–2.0)
Bicarbonate: 13.4 mmol/L — ABNORMAL LOW (ref 20.0–28.0)
O2 Content: 10 L/min
O2 Saturation: 99.6 %
Patient temperature: 37
pCO2 arterial: 26 mmHg — ABNORMAL LOW (ref 32–48)
pH, Arterial: 7.32 — ABNORMAL LOW (ref 7.35–7.45)
pO2, Arterial: 100 mmHg (ref 83–108)

## 2024-08-29 LAB — RESP PANEL BY RT-PCR (RSV, FLU A&B, COVID)  RVPGX2
Influenza A by PCR: POSITIVE — AB
Influenza B by PCR: NEGATIVE
Resp Syncytial Virus by PCR: NEGATIVE
SARS Coronavirus 2 by RT PCR: NEGATIVE

## 2024-08-29 LAB — LACTIC ACID, PLASMA
Lactic Acid, Venous: 2 mmol/L (ref 0.5–1.9)
Lactic Acid, Venous: 1.9 mmol/L (ref 0.5–1.9)

## 2024-08-29 LAB — COMPREHENSIVE METABOLIC PANEL WITH GFR
ALT: 69 U/L — ABNORMAL HIGH (ref 0–44)
AST: 72 U/L — ABNORMAL HIGH (ref 15–41)
Albumin: 3.2 g/dL — ABNORMAL LOW (ref 3.5–5.0)
Alkaline Phosphatase: 107 U/L (ref 38–126)
Anion gap: 11 (ref 5–15)
BUN: 143 mg/dL — ABNORMAL HIGH (ref 8–23)
CO2: 13 mmol/L — ABNORMAL LOW (ref 22–32)
Calcium: 8.5 mg/dL — ABNORMAL LOW (ref 8.9–10.3)
Chloride: 117 mmol/L — ABNORMAL HIGH (ref 98–111)
Creatinine, Ser: 2.58 mg/dL — ABNORMAL HIGH (ref 0.61–1.24)
GFR, Estimated: 23 mL/min — ABNORMAL LOW (ref >60)
Glucose, Bld: 252 mg/dL — ABNORMAL HIGH (ref 70–99)
Potassium: 5.7 mmol/L — ABNORMAL HIGH (ref 3.5–5.1)
Sodium: 141 mmol/L (ref 135–145)
Total Bilirubin: 0.4 mg/dL (ref 0.0–1.2)
Total Protein: 6.4 g/dL — ABNORMAL LOW (ref 6.5–8.1)

## 2024-08-29 LAB — TROPONIN I (HIGH SENSITIVITY)
Troponin I (High Sensitivity): 322 ng/L (ref <18)
Troponin I (High Sensitivity): 328 ng/L (ref <18)
Troponin I (High Sensitivity): 331 ng/L (ref <18)

## 2024-08-29 LAB — BLOOD GAS, VENOUS: Patient temperature: 37

## 2024-08-29 LAB — PROTIME-INR
INR: 1.1 (ref 0.8–1.2)
Prothrombin Time: 14.8 s (ref 11.4–15.2)

## 2024-08-29 LAB — BRAIN NATRIURETIC PEPTIDE: B Natriuretic Peptide: 413.4 pg/mL — ABNORMAL HIGH (ref 0.0–100.0)

## 2024-08-29 LAB — HEPARIN LEVEL (UNFRACTIONATED): Heparin Unfractionated: <0.1 [IU]/mL — ABNORMAL LOW (ref 0.30–0.70)

## 2024-08-29 LAB — APTT: aPTT: 37 s — ABNORMAL HIGH (ref 24–36)

## 2024-08-29 MED ORDER — OSELTAMIVIR PHOSPHATE 75 MG PO CAPS
75.0000 mg | ORAL_CAPSULE | Freq: Two times a day (BID) | ORAL | Status: DC
  Administered 2024-08-29 – 2024-08-30 (×2): 75 mg via ORAL
  Filled 2024-08-29 (×3): qty 1

## 2024-08-29 MED ORDER — MAGNESIUM HYDROXIDE 400 MG/5ML PO SUSP: 30.0000 mL | Freq: Every day | ORAL | Status: DC | PRN

## 2024-08-29 MED ORDER — ONDANSETRON HCL 4 MG/2ML IJ SOLN: 4.0000 mg | Freq: Four times a day (QID) | INTRAMUSCULAR | Status: DC | PRN

## 2024-08-29 MED ORDER — HYDROCOD POLI-CHLORPHE POLI ER 10-8 MG/5ML PO SUER: 5.0000 mL | Freq: Two times a day (BID) | ORAL | Status: DC | PRN

## 2024-08-29 MED ORDER — SODIUM CHLORIDE 0.9 % IV SOLN
2.0000 g | Freq: Once | INTRAVENOUS | Status: AC
  Administered 2024-08-29: 2 g via INTRAVENOUS
  Filled 2024-08-29: qty 20

## 2024-08-29 MED ORDER — SODIUM ZIRCONIUM CYCLOSILICATE 10 G PO PACK
10.0000 g | PACK | Freq: Once | ORAL | Status: DC
  Filled 2024-08-29: qty 1

## 2024-08-29 MED ORDER — SODIUM CHLORIDE 0.9 % IV SOLN
2.0000 g | INTRAVENOUS | Status: AC
  Administered 2024-08-30 – 2024-09-03 (×5): 2 g via INTRAVENOUS
  Filled 2024-08-29 (×5): qty 20

## 2024-08-29 MED ORDER — SODIUM CHLORIDE 0.9 % IV SOLN
500.0000 mg | INTRAVENOUS | Status: AC
  Administered 2024-08-30 – 2024-09-03 (×5): 500 mg via INTRAVENOUS
  Filled 2024-08-29 (×5): qty 5

## 2024-08-29 MED ORDER — SODIUM CHLORIDE 0.9 % IV SOLN
500.0000 mg | Freq: Once | INTRAVENOUS | Status: AC
  Administered 2024-08-29: 500 mg via INTRAVENOUS
  Filled 2024-08-29: qty 5

## 2024-08-29 MED ORDER — DONEPEZIL HCL 5 MG PO TABS
5.0000 mg | ORAL_TABLET | Freq: Every day | ORAL | Status: DC
  Administered 2024-08-29 – 2024-09-04 (×5): 5 mg via ORAL
  Filled 2024-08-29 (×9): qty 1

## 2024-08-29 MED ORDER — ALLOPURINOL 100 MG PO TABS
100.0000 mg | ORAL_TABLET | Freq: Every day | ORAL | Status: DC
  Administered 2024-08-30 – 2024-09-03 (×4): 100 mg via ORAL
  Filled 2024-08-29 (×6): qty 1

## 2024-08-29 MED ORDER — PATIROMER SORBITEX CALCIUM 8.4 G PO PACK
25.2000 g | PACK | Freq: Once | ORAL | Status: AC
  Administered 2024-08-29: 25.2 g via ORAL
  Filled 2024-08-29: qty 3

## 2024-08-29 MED ORDER — LACTATED RINGERS IV BOLUS (SEPSIS)
1000.0000 mL | Freq: Once | INTRAVENOUS | Status: AC
  Administered 2024-08-29: 1000 mL via INTRAVENOUS

## 2024-08-29 MED ORDER — HEPARIN BOLUS VIA INFUSION
3500.0000 [IU] | Freq: Once | INTRAVENOUS | Status: AC
  Administered 2024-08-29: 3500 [IU] via INTRAVENOUS
  Filled 2024-08-29: qty 3500

## 2024-08-29 MED ORDER — HEPARIN (PORCINE) 25000 UT/250ML-% IV SOLN
750.0000 [IU]/h | INTRAVENOUS | Status: DC
  Administered 2024-08-29: 750 [IU]/h via INTRAVENOUS
  Filled 2024-08-29: qty 250

## 2024-08-29 MED ORDER — STERILE WATER FOR INJECTION IV SOLN
INTRAVENOUS | Status: DC
  Filled 2024-08-29 (×3): qty 1000
  Filled 2024-08-29 (×2): qty 150
  Filled 2024-08-29 (×2): qty 1000
  Filled 2024-08-29: qty 150

## 2024-08-29 MED ORDER — GUAIFENESIN ER 600 MG PO TB12
600.0000 mg | ORAL_TABLET | Freq: Two times a day (BID) | ORAL | Status: DC
  Administered 2024-08-29 – 2024-09-02 (×5): 600 mg via ORAL
  Filled 2024-08-29 (×8): qty 1

## 2024-08-29 MED ORDER — IPRATROPIUM-ALBUTEROL 0.5-2.5 (3) MG/3ML IN SOLN
3.0000 mL | Freq: Four times a day (QID) | RESPIRATORY_TRACT | Status: DC
  Administered 2024-08-29: 3 mL via RESPIRATORY_TRACT
  Filled 2024-08-29: qty 3

## 2024-08-29 MED ORDER — AMLODIPINE BESYLATE 5 MG PO TABS
5.0000 mg | ORAL_TABLET | Freq: Every day | ORAL | Status: DC
  Administered 2024-08-30: 5 mg via ORAL
  Filled 2024-08-29 (×2): qty 1

## 2024-08-29 MED ORDER — LACTATED RINGERS IV BOLUS
1000.0000 mL | Freq: Once | INTRAVENOUS | Status: AC
  Administered 2024-08-29: 1000 mL via INTRAVENOUS

## 2024-08-29 MED ORDER — STERILE WATER FOR INJECTION IV SOLN: INTRAVENOUS | Status: DC

## 2024-08-29 MED ORDER — ACETAMINOPHEN 650 MG RE SUPP: 650.0000 mg | Freq: Four times a day (QID) | RECTAL | Status: DC | PRN

## 2024-08-29 MED ORDER — ONDANSETRON HCL 4 MG PO TABS: 4.0000 mg | ORAL_TABLET | Freq: Four times a day (QID) | ORAL | Status: DC | PRN

## 2024-08-29 MED ORDER — ACETAMINOPHEN 325 MG PO TABS
650.0000 mg | ORAL_TABLET | Freq: Four times a day (QID) | ORAL | Status: DC | PRN
  Administered 2024-08-30 – 2024-09-03 (×4): 650 mg via ORAL
  Filled 2024-08-29 (×5): qty 2

## 2024-08-29 MED ORDER — TAMSULOSIN HCL 0.4 MG PO CAPS
0.4000 mg | ORAL_CAPSULE | Freq: Every day | ORAL | Status: DC
  Administered 2024-08-30 – 2024-09-03 (×4): 0.4 mg via ORAL
  Filled 2024-08-29 (×6): qty 1

## 2024-08-29 MED ORDER — TRAZODONE HCL 50 MG PO TABS: 25.0000 mg | ORAL_TABLET | Freq: Every evening | ORAL | Status: DC | PRN

## 2024-08-29 NOTE — Consult Note (Signed)
 Pharmacy Consult Note - Anticoagulation  Pharmacy Consult for Heparin  Indication: chest pain/ACS No Known Allergies  PATIENT MEASUREMENTS: Height: 5' 7 (170.2 cm) Weight: 61.2 kg (134 lb 14.7 oz) IBW/kg (Calculated) : 66.1 HEPARIN  DW (KG): 61.2  VITAL SIGNS: Temp: 98 F (36.7 C) (08/31 2139) Temp Source: Oral (08/31 2139) BP: 154/65 (08/31 2139) Pulse Rate: 70 (08/31 2139)  Recent Labs    08/29/24 1801  HGB 9.8*  HCT 31.1*  PLT 220  CREATININE 2.58*  TROPONINIHS 322*    Estimated Creatinine Clearance: 17.5 mL/min (A) (by C-G formula based on SCr of 2.58 mg/dL (H)).  PAST MEDICAL HISTORY: History reviewed. No pertinent past medical history.  Medications:  Medications Prior to Admission  Medication Sig Dispense Refill Last Dose/Taking   allopurinol  (ZYLOPRIM ) 100 MG tablet Take 100 mg by mouth daily.      amLODipine  (NORVASC ) 5 MG tablet Take 5 mg by mouth daily.      CANNABIDIOL PO Take by mouth.      Cholecalciferol (VITAMIN D-3) 125 MCG (5000 UT) TABS Take by mouth.      donepezil  (ARICEPT ) 5 MG tablet Take 5 mg by mouth at bedtime.      Ginkgo Biloba 40 MG TABS Take by mouth.      Misc Natural Products (OSTEO BI-FLEX TRIPLE STRENGTH PO) Take by mouth.      Multiple Vitamins-Minerals (CENTRUM SILVER 50+MEN) TABS Take by mouth.      tamsulosin  (FLOMAX ) 0.4 MG CAPS capsule Take 0.4 mg by mouth.      triamcinolone cream (KENALOG) 0.1 % Apply 1 application topically 2 (two) times daily.      Scheduled:   [START ON 08/30/2024] allopurinol   100 mg Oral Daily   [START ON 08/30/2024] amLODipine   5 mg Oral Daily   donepezil   5 mg Oral QHS   guaiFENesin   600 mg Oral BID   heparin   3,500 Units Intravenous Once   ipratropium-albuterol   3 mL Nebulization QID   oseltamivir   75 mg Oral BID   [START ON 08/30/2024] tamsulosin   0.4 mg Oral QPC supper   Infusions:   [START ON 08/30/2024] azithromycin      [START ON 08/30/2024] cefTRIAXone  (ROCEPHIN )  IV     heparin      lactated  ringers      PRN: acetaminophen  **OR** acetaminophen , chlorpheniramine-HYDROcodone, magnesium  hydroxide, ondansetron  **OR** ondansetron  (ZOFRAN ) IV, traZODone   ASSESSMENT: 89 y.o. male with PMH advanced dementia, chronic kidney disease, and atrial fibrillation is presenting with shortness of breath and weakness. Patient is not on chronic anticoagulation per chart review. Pharmacy has been consulted to initiate and manage heparin  intravenous infusion.  Goal(s) of therapy: Heparin  level 0.3 - 0.7 units/mL aPTT 66 - 102 seconds Monitor platelets by anticoagulation protocol: Yes   Baseline anticoagulation labs: Recent Labs    08/29/24 1801  HGB 9.8*  PLT 220   Baseline labs: CBC stable, aPTT pending, INR pending, Heparin  level pending   PLAN:  Give 3500 units bolus x1; then start heparin  infusion at 750 units/hour.  Check heparin  level in 8 hours, then daily once at least two levels are consecutively therapeutic.  Monitor CBC daily while on heparin  infusion.   Annabella LOISE Banks, PharmD Clinical Pharmacist 08/29/2024 9:42 PM

## 2024-08-29 NOTE — H&P (Signed)
 [place]   PATIENT NAME: Adrian Michael    MR#:  [id number]  DATE OF BIRTH:  04-06-1935  DATE OF ADMISSION:  08/29/2024  PRIMARY CARE PHYSICIAN: Godfrey Area, MD   Patient is coming from: Home  REQUESTING/REFERRING PHYSICIAN: Jacolyn Guild, MD  CHIEF COMPLAINT:   Chief Complaint  Patient presents with   Respiratory Distress    HISTORY OF PRESENT ILLNESS:  Nathanal Hermiz is a 89 y.o. Caucasian male with medical history significant for essential hypertension, mixed Alzheimer's and vascular dementia, atrial fibrillation, GERD, hypothyroidism, type 2 diabetes mellitus, chronic kidney disease, gout, dyslipidemia, hyperparathyroidism and BPH, who presented to the emergency room with acute onset of worsening dyspnea and generalized weakness.  The patient's daughter is giving the history as the patient is poor historian due to his dementia.  The patient has been staying with family members in [place] but became malnourished and was recently admitted to the ICU.  After his discharge he spent several weeks in rehabilitation and then his daughter decided to bring him back here to take care of him.  She is his POA.  He came here about 6 days ago.  Since he came he has been eating boost.  He was at baseline until discharge and later developed fever that was up to 101 at home with generalized weakness and diminished appetite.  He later developed dyspnea and his oxygen saturation was down to the 80s.  He tested positive for influenza several days ago.  ED Course: When the patient came to the emergency room, BP was 148/65 with.  The rate of 25 and pulse oximetry 100% on 15 L O2 by 9 rebreather and later on on 10 L of O2 by nasal cannula.  Labs revealed VBG with pH 7.15 and HCO3 of 14.3 and ABG came back with pH 7.32 with HCO3 of 13.4 and PCO2 of 26 and pO2 of 100 with O2 sat of 99.6% on room air.  CMP was remarkable for potassium 5.7 chloride 117 BUN of 143 and creatinine 2.58 and anion  gap of 11 with CO2 13 and AST 72 and ALT 69 with total protein of 6.4 and total protein of 6.4.  Lactic acid was 2 and later 1.9.  CBC showed hemoglobin 9.8 hematocrit 31.1 with macrocytosis.  Blood glucose was 252.  UA was negative. EKG as reviewed by me : EKG showed atrial fibrillation with controlled ventricular response of 79 and left anterior fascicular block poor R wave progression.  Imaging: Portable chest x-ray showed mild patchy airspace opacities in both lower lobes concerning for pneumonia with recommendation for follow-up PA and lateral chest x-ray in 4 to 6 weeks.  The patient was given 1 L bolus of IV lactated Ringer , 2 g of IV Rocephin  and 500 mg of IV Zithromax .  He will be admitted to a progressive unit bed for further evaluation and management PAST MEDICAL HISTORY: Noncontrasted head CT scan revealed atrophy and chronic small vessel ischemic changes with no acute intracranial normality.   Essential hypertension, mixed Alzheimer's and vascular dementia, GERD, hypothyroidism, type 2 diabetes mellitus, chronic kidney disease, gout, dyslipidemia, hyperparathyroidism and BPH.  PAST SURGICAL HISTORY:  History reviewed. No pertinent surgical history.  SOCIAL HISTORY:   Social History   Tobacco Use   Smoking status: Not on file   Smokeless tobacco: Not on file  Substance Use Topics   Alcohol use: Not on file    FAMILY HISTORY:  History reviewed. No pertinent family history.  DRUG ALLERGIES:  No Known Allergies  REVIEW OF SYSTEMS:   ROS As per history of present illness. All pertinent systems were reviewed above. Constitutional, HEENT, cardiovascular, respiratory, GI, GU, musculoskeletal, neuro, psychiatric, endocrine, integumentary and hematologic systems were reviewed and are otherwise negative/unremarkable except for positive findings mentioned above in the HPI.   MEDICATIONS AT HOME:   Prior to Admission medications   Medication Sig Start Date End Date Taking?  Authorizing Provider  allopurinol  (ZYLOPRIM ) 100 MG tablet Take 100 mg by mouth daily.    [provider]  amLODipine  (NORVASC ) 5 MG tablet Take 5 mg by mouth daily.    [provider]  CANNABIDIOL PO Take by mouth.    [provider]  Cholecalciferol (VITAMIN D-3) 125 MCG (5000 UT) TABS Take by mouth.    [provider]  donepezil  (ARICEPT ) 5 MG tablet Take 5 mg by mouth at bedtime.    [provider]  Ginkgo Biloba 40 MG TABS Take by mouth.    [provider]  Misc Natural Products (OSTEO BI-FLEX TRIPLE STRENGTH PO) Take by mouth.    [provider]  Multiple Vitamins-Minerals (CENTRUM SILVER 50+MEN) TABS Take by mouth.    [provider]  tamsulosin  (FLOMAX ) 0.4 MG CAPS capsule Take 0.4 mg by mouth.    [provider]  triamcinolone cream (KENALOG) 0.1 % Apply 1 application topically 2 (two) times daily.    [provider]      VITAL SIGNS:  Blood pressure (!) 154/65, pulse 70, temperature 98 F (36.7 C), temperature source Oral, resp. rate 18, height 5' 7 (1.702 m), weight 61.2 kg, SpO2 96%.  PHYSICAL EXAMINATION:  Physical Exam  GENERAL:  89 y.o.-year-old patient lying in the bed with no acute distress.  EYES: Pupils equal, round, reactive to light and accommodation. No scleral icterus. Extraocular muscles intact.  HEENT: Head atraumatic, normocephalic. Oropharynx and nasopharynx clear.  NECK:  Supple, no jugular venous distention. No thyroid enlargement, no tenderness.  LUNGS: Normal breath sounds bilaterally, no wheezing, rales,rhonchi or crepitation. No use of accessory muscles of respiration.  CARDIOVASCULAR: Regular rate and rhythm, S1, S2 normal. No murmurs, rubs, or gallops.  ABDOMEN: Soft, nondistended, nontender. Bowel sounds present. No organomegaly or mass.  EXTREMITIES: No pedal edema, cyanosis, or clubbing.  NEUROLOGIC: Cranial nerves II through XII are intact. Muscle strength 5/5  in all extremities. Sensation intact. Gait not checked.  PSYCHIATRIC: The patient is alert and oriented x 3.  Normal affect and good eye contact. SKIN: No obvious rash, lesion, or ulcer.   LABORATORY PANEL:   CBC Recent Labs  Lab 08/29/24 1801  WBC 5.6  HGB 9.8*  HCT 31.1*  PLT 220   ------------------------------------------------------------------------------------------------------------------  Chemistries  Recent Labs  Lab 08/29/24 1801  NA 141  K 5.7*  CL 117*  CO2 13*  GLUCOSE 252*  BUN 143*  CREATININE 2.58*  CALCIUM  8.5*  AST 72*  ALT 69*  ALKPHOS 107  BILITOT 0.4   ------------------------------------------------------------------------------------------------------------------  Cardiac Enzymes No results for input(s): TROPONINI in the last 168 hours. ------------------------------------------------------------------------------------------------------------------  RADIOLOGY:  CT Head Wo Contrast Result Date: 08/29/2024 CLINICAL DATA:  Mental status change, unknown cause EXAM: CT HEAD WITHOUT CONTRAST TECHNIQUE: Contiguous axial images were obtained from the base of the skull through the vertex without intravenous contrast. RADIATION DOSE REDUCTION: This exam was performed according to the departmental dose-optimization program which includes automated exposure control, adjustment of the mA and/or kV according to patient size and/or use  of iterative reconstruction technique. COMPARISON:  None Available. FINDINGS: Brain: There is periventricular white matter decreased attenuation consistent with small vessel ischemic changes. Ventricles, sulci and cisterns are prominent consistent with age related involutional changes. No acute intracranial hemorrhage, mass effect or shift. No hydrocephalus. Vascular: No hyperdense vessel or unexpected calcification. Skull: Normal. Negative for fracture or focal lesion. Sinuses/Orbits: No acute finding. IMPRESSION: Atrophy and  chronic small vessel ischemic changes. No acute intracranial process identified. Electronically Signed   By: Fonda Field M.D.   On: 08/29/2024 18:39   DG Chest Port 1 View Result Date: 08/29/2024 CLINICAL DATA:  Shortness of breath EXAM: PORTABLE CHEST 1 VIEW COMPARISON:  None Available. FINDINGS: There are mild patchy airspace opacities in both lower lobes. There is no pleural effusion or pneumothorax. Cardiomediastinal silhouette demonstrates mild cardiomegaly. There is no pneumothorax or acute fracture. IMPRESSION: Mild patchy airspace opacities in both lower lobes, concerning for pneumonia. Follow-up PA and lateral chest x-ray recommended in 4-6 weeks to confirm resolution. Electronically Signed   By: Greig Pique M.D.   On: 08/29/2024 18:28      IMPRESSION AND PLAN:  Assessment and Plan: * Influenzal pneumonia - The patient will be admitted to a progressive unit bed. - Will continue antibiotic therapy with IV Rocephin  and Zithromax . - Will place him on p.o. Tamiflu .   Acute kidney injury superimposed on chronic kidney disease (HCC) - We will manage associated metabolic acidosis with sodium bicarbonate . - Will monitor for his dyspnea given elevated BNP. - He will be given Veltassa  for hyperkalemia. - Urgent nephrology consultation will be obtained. - Dr. Marcelino was notified.  Elevated troponin I level - This could be related to troponin leak with demand ischemia due to his pneumonia and AKI. - Will place him on IV heparin  for now and obtain a 2D echo. - Cardiology consult to be obtained. - I notified Dr. Fernand covering [facility] about the patient.  Essential hypertension - Will continue antihypertensive therapy while holding off nephrotoxins  Elevated brain natriuretic peptide (BNP) level - His chest x-ray is not showing signs of CHF and he has no previous history. - This could be related to his AKI on CKD. - He will be closely monitored for fluid overload. - 2D  echo will be obtained and cardiology consult as mentioned above  Mixed Alzheimer's and vascular dementia (HCC) - Will continue Aricept .  BPH (benign prostatic hyperplasia) - Will continue Flomax .  Gout - Will continue allopurinol .   DVT prophylaxis: IV heparin . Advanced Care Planning:  Code Status: full code. Family Communication:  The plan of care was discussed in details with the patient (and family). I answered all questions. The patient agreed to proceed with the above mentioned plan. Further management will depend upon hospital course. Disposition Plan: Back to previous home environment Consults called: Nephrology and cardiology All the records are reviewed and case discussed with ED provider.  Status is: Inpatient  At the time of the admission, it appears that the appropriate admission status for this patient is inpatient.  This is judged to be reasonable and necessary in order to provide the required intensity of service to ensure the patient's safety given the presenting symptoms, physical exam findings and initial radiographic and laboratory data in the context of comorbid conditions.  The patient requires inpatient status due to high intensity of service, high risk of further deterioration and high frequency of surveillance required.  I certify that at the time of admission, it is my clinical  judgment that the patient will require inpatient hospital care extending more than 2 midnights.                            Dispo: The patient is from: Home              Anticipated d/c is to: Home              Patient currently is not medically stable to d/c.              Difficult to place patient: No  Madison DELENA Peaches M.D on 08/29/2024 at 10:30 PM  [facility]   From 7 PM-7 AM, contact night-coverage [email or web address]  CC: Primary care physician; Godfrey Area, MD

## 2024-08-29 NOTE — Assessment & Plan Note (Signed)
 Blood cultures remain negative and respiratory panel positive for influenza A. - Continue ceftriaxone  and Zithromax  to complete a 5-day course for concern of superadded bacterial pneumonia - Continue with Tamiflu 

## 2024-08-29 NOTE — Assessment & Plan Note (Signed)
Will continue allopurinol. 

## 2024-08-29 NOTE — Assessment & Plan Note (Addendum)
 Likely demand ischemia.  Echocardiogram was normal. Cardiology was consulted-no need for any further investigation so cardiology signed off.

## 2024-08-29 NOTE — Assessment & Plan Note (Signed)
 BNP of 413 on admission, clinically appears dry.  Echocardiogram was normal.  Likely due to CKD and respiratory distress with influenza pneumonia. - Monitor volume status closely

## 2024-08-29 NOTE — ED Triage Notes (Addendum)
 Pt BIB [facility] from Home for respiratory distress. Pt came to [facility] on Monday to stay with family, daughter Dx with flu on Wednesday. Pt having fever yesterday but none today. Last tylenol  was this morning 0800. Pt is Aox2 at baseline. EMS reports diminished lung sounds in base, rhonchi in upper lobes with pinpoint, non-reactive pupils. Found to be 84%RA on EMS arrival, presents with non-rebreather 15L 97%. Per daughter - stopped eating a month ago, started eating boost 3-4 a day. Today pt eating 1.5 boost. Speech is very garbled, suspect stroke at some point, does not converse much anymore.  Vitals: hr 50, 33rr, ET 23, 153/62, CBG 275

## 2024-08-29 NOTE — Assessment & Plan Note (Addendum)
 Patient has an history of CKD stage IV. Improving renal function with creatinine at 2.01 today.  Seems to be around baseline now.  Metabolic acidosis has been resolved.  Nephrology is on board.  -Monitor renal function -Avoid nephrotoxins

## 2024-08-29 NOTE — Assessment & Plan Note (Signed)
-   Will continue Aricept .

## 2024-08-29 NOTE — Assessment & Plan Note (Signed)
-   Will continue antihypertensive therapy while holding off nephrotoxins.

## 2024-08-29 NOTE — ED Notes (Signed)
 RT contacted regarding the patients O2 requirements - advised to placed pt on 6L [place]. Primary RN made aware.

## 2024-08-29 NOTE — Assessment & Plan Note (Signed)
Will continue Flomax.

## 2024-08-29 NOTE — ED Notes (Signed)
 Pt taken to CT.

## 2024-08-29 NOTE — ED Provider Notes (Signed)
 [facility] Provider Note    Event Date/Time   First MD Initiated Contact with Patient 08/29/24 1751     (approximate)   History   Respiratory Distress   HPI  Adrian Michael is a 89 y.o. male with a history of advanced dementia, chronic kidney disease, and atrial fibrillation who presents with shortness of breath and weakness.  The patient's daughter is the primary historian.  She states that the patient had been staying with different family members in [place], but became malnourished and recently ended up admitted to the ICU.  After he was discharged and spent several weeks in rehab, she decided to bring him back to [place]  to help take care of him.  She is the healthcare power of attorney.  He returned to [place]  6 days ago.  She states that since that time he has been eating boost.  He was at his baseline until yesterday when he started develop a fever as high as 101 at home, weakness, and then started to have a decreased appetite.  Today he became very short of breath and his O2 saturation was in the 80s.  The patient's daughter also reports that she tested positive for the flu several days ago.  I reviewed the past medical records.  The patient's most recent outpatient encounters in our system are with physiatry and neurology in 2022 for evaluation of chronic symptoms.  The patient's daughter provides the additional history, and I reviewed his PMH with her.   Physical Exam   Triage Vital Signs: ED Triage Vitals  Encounter Vitals Group     BP 08/29/24 1757 (!) 148/65     Girls Systolic BP Percentile --      Girls Diastolic BP Percentile --      Boys Systolic BP Percentile --      Boys Diastolic BP Percentile --      Pulse Rate 08/29/24 1757 71     Resp 08/29/24 1757 (!) 25     Temp --      Temp src --      SpO2 08/29/24 1757 100 %     Weight --      Height 08/29/24 1758 5' 7 (1.702 m)     Head Circumference --      Peak Flow --       Pain Score --      Pain Loc --      Pain Education --      Exclude from Growth Chart --     Most recent vital signs: Vitals:   08/29/24 1757 08/29/24 1930  BP: (!) 148/65 138/80  Pulse: 71 79  Resp: (!) 25 19  SpO2: 100% 100%     General: Awake, very weak appearing CV:  Good peripheral perfusion.  Resp:  Increased respiratory effort.  Diminished breath sounds bilaterally. Abd:  No distention.  Other:  Motor intact in all extremities.  Dry mucous membranes   ED Results / Procedures / Treatments   Labs (all labs ordered are listed, but only abnormal results are displayed) Labs Reviewed  RESP PANEL BY RT-PCR (RSV, FLU A&B, COVID)  RVPGX2 - Abnormal; Notable for the following components:      Result Value   Influenza A by PCR POSITIVE (*)    All other components within normal limits  COMPREHENSIVE METABOLIC PANEL WITH GFR - Abnormal; Notable for the following components:   Potassium 5.7 (*)    Chloride 117 (*)  CO2 13 (*)    Glucose, Bld 252 (*)    BUN 143 (*)    Creatinine, Ser 2.58 (*)    Calcium  8.5 (*)    Total Protein 6.4 (*)    Albumin 3.2 (*)    AST 72 (*)    ALT 69 (*)    GFR, Estimated 23 (*)    All other components within normal limits  BRAIN NATRIURETIC PEPTIDE - Abnormal; Notable for the following components:   B Natriuretic Peptide 413.4 (*)    All other components within normal limits  CBC WITH DIFFERENTIAL/PLATELET - Abnormal; Notable for the following components:   RBC 2.90 (*)    Hemoglobin 9.8 (*)    HCT 31.1 (*)    MCV 107.2 (*)    All other components within normal limits  LACTIC ACID, PLASMA - Abnormal; Notable for the following components:   Lactic Acid, Venous 2.0 (*)    All other components within normal limits  BLOOD GAS, VENOUS - Abnormal; Notable for the following components:   pH, Ven 7.15 (*)    pCO2, Ven 41 (*)    Bicarbonate 14.3 (*)    Acid-base deficit 14.0 (*)    All other components within normal limits  BLOOD GAS,  ARTERIAL - Abnormal; Notable for the following components:   pH, Arterial 7.32 (*)    pCO2 arterial 26 (*)    Bicarbonate 13.4 (*)    Acid-base deficit 11.0 (*)    All other components within normal limits  TROPONIN I (HIGH SENSITIVITY) - Abnormal; Notable for the following components:   Troponin I (High Sensitivity) 322 (*)    All other components within normal limits  CULTURE, BLOOD (ROUTINE X 2)  CULTURE, BLOOD (ROUTINE X 2)  LACTIC ACID, PLASMA  URINALYSIS, ROUTINE W REFLEX MICROSCOPIC  TROPONIN I (HIGH SENSITIVITY)     EKG  ED ECG REPORT I, Waylon Cassis, the attending physician, personally viewed and interpreted this ECG.  Date: 08/29/2024 EKG Time: 1751 Rate: 79 Rhythm: Atrial fibrillation QRS Axis: normal Intervals: LAFB ST/T Wave abnormalities: normal Narrative Interpretation: no evidence of acute ischemia    RADIOLOGY  Chest x-ray: I independently viewed and interpreted the images; there are bilateral interstitial opacities  CT head: Atrophy with no acute abnormalities  PROCEDURES:  Critical Care performed: Yes, see critical care procedure note(s)  .Critical Care  Performed by: Cassis Waylon, MD Authorized by: Cassis Waylon, MD   Critical care provider statement:    Critical care time (minutes):  30   Critical care time was exclusive of:  Separately billable procedures and treating other patients   Critical care was necessary to treat or prevent imminent or life-threatening deterioration of the following conditions:  Respiratory failure and sepsis   Critical care was time spent personally by me on the following activities:  Development of treatment plan with patient or surrogate, discussions with consultants, evaluation of patient's response to treatment, examination of patient, ordering and review of laboratory studies, ordering and review of radiographic studies, ordering and performing treatments and interventions, pulse oximetry,  re-evaluation of patient's condition, review of old charts and obtaining history from patient or surrogate   Care discussed with: admitting provider      MEDICATIONS ORDERED IN ED: Medications  lactated ringers  bolus 1,000 mL (has no administration in time range)  lactated ringers  bolus 1,000 mL (1,000 mLs Intravenous New Bag/Given 08/29/24 1804)  cefTRIAXone  (ROCEPHIN ) 2 g in sodium chloride  0.9 % 100 mL IVPB (0 g Intravenous  Stopped 08/29/24 1928)  azithromycin  (ZITHROMAX ) 500 mg in sodium chloride  0.9 % 250 mL IVPB (500 mg Intravenous New Bag/Given 08/29/24 1848)     IMPRESSION / MDM / ASSESSMENT AND PLAN / ED COURSE  I reviewed the triage vital signs and the nursing notes.  89 year old male with PMH as noted above presents with generalized weakness, decreased appetite, fever, and increased shortness of breath/hypoxia over the last day.  His daughter is positive for flu.  On exam the patient is awake  Differential diagnosis includes, but is not limited to, influenza, COVID, pneumonia, acute bronchitis, UTI or other acute infection/sepsis, new onset CHF/fluid overload, less likely other cardiac etiology, or possible electrolyte abnormalities, AKI, other metabolic disturbance, CNS cause.  We will obtain CT head, chest x-ray, lab workup, and reassess.  The patient's daughter states that she has not filled out DNR paperwork although she is the POA.  She does not want chest compressions, but would want treatment short of that including pressors if indicated.  She is not yet decided on intubation.  Patient's presentation is most consistent with acute presentation with potential threat to life or bodily function.  The patient is on the cardiac monitor to evaluate for evidence of arrhythmia and/or significant heart rate changes.   ----------------------------------------- 8:37 PM on 08/29/2024 -----------------------------------------  Chest x-ray shows bilateral opacities consistent with  pneumonia so I ordered IV antibiotics.  However the patient's respiratory panel was also positive for flu.  Lab workup is significant for elevated creatinine, although the patient's baseline is unknown.  He is mildly anemic.  Troponin is elevated, likely demand ischemia.  Lactate is mildly elevated.  Initial VBG showed a pH of 7.15, however the ABG is improved.  CT head is negative.  The patient is doing well on O2 by high flow nasal cannula.   FINAL CLINICAL IMPRESSION(S) / ED DIAGNOSES   Final diagnoses:  Acute respiratory failure with hypoxia (HCC)  Influenza A     Rx / DC Orders   ED Discharge Orders     None        Note:  This document was prepared using Dragon voice recognition software and may include unintentional dictation errors.    Jacolyn Pae, MD 08/29/24 2039

## 2024-08-30 ENCOUNTER — Inpatient Hospital Stay
Admit: 2024-08-30 | Discharge: 2024-08-30 | Disposition: A | Discharge status: Home / Self Care | Attending: Family Medicine

## 2024-08-30 DIAGNOSIS — R0602 Shortness of breath: Secondary | ICD-10-CM | POA: Diagnosis not present

## 2024-08-30 DIAGNOSIS — R7989 Other specified abnormal findings of blood chemistry: Secondary | ICD-10-CM | POA: Diagnosis not present

## 2024-08-30 DIAGNOSIS — J1100 Influenza due to unidentified influenza virus with unspecified type of pneumonia: Secondary | ICD-10-CM | POA: Diagnosis not present

## 2024-08-30 LAB — ECHOCARDIOGRAM COMPLETE
AR max vel: 3.34 cm2
AV Peak grad: 5.7 mmHg
Ao pk vel: 1.19 m/s
Area-P 1/2: 3.08 cm2
Height: 67 in
P 1/2 time: 810 ms
S' Lateral: 3.1 cm
Weight: 2158.74 [oz_av]

## 2024-08-30 LAB — CBC
HCT: 27.2 % — ABNORMAL LOW (ref 39.0–52.0)
Hemoglobin: 8.8 g/dL — ABNORMAL LOW (ref 13.0–17.0)
MCH: 33.3 pg (ref 26.0–34.0)
MCHC: 32.4 g/dL (ref 30.0–36.0)
MCV: 103 fL — ABNORMAL HIGH (ref 80.0–100.0)
Platelets: 211 K/uL (ref 150–400)
RBC: 2.64 MIL/uL — ABNORMAL LOW (ref 4.22–5.81)
RDW: 14.3 % (ref 11.5–15.5)
WBC: 7.5 K/uL (ref 4.0–10.5)
nRBC: 0 % (ref 0.0–0.2)

## 2024-08-30 LAB — COMPREHENSIVE METABOLIC PANEL WITH GFR
ALT: 57 U/L — ABNORMAL HIGH (ref 0–44)
AST: 69 U/L — ABNORMAL HIGH (ref 15–41)
Albumin: 2.5 g/dL — ABNORMAL LOW (ref 3.5–5.0)
Alkaline Phosphatase: 80 U/L (ref 38–126)
Anion gap: 10 (ref 5–15)
BUN: 118 mg/dL — ABNORMAL HIGH (ref 8–23)
CO2: 17 mmol/L — ABNORMAL LOW (ref 22–32)
Calcium: 8.5 mg/dL — ABNORMAL LOW (ref 8.9–10.3)
Chloride: 114 mmol/L — ABNORMAL HIGH (ref 98–111)
Creatinine, Ser: 2.39 mg/dL — ABNORMAL HIGH (ref 0.61–1.24)
GFR, Estimated: 26 mL/min — ABNORMAL LOW (ref >60)
Glucose, Bld: 133 mg/dL — ABNORMAL HIGH (ref 70–99)
Potassium: 5.1 mmol/L (ref 3.5–5.1)
Sodium: 141 mmol/L (ref 135–145)
Total Bilirubin: 0.5 mg/dL (ref 0.0–1.2)
Total Protein: 5.1 g/dL — ABNORMAL LOW (ref 6.5–8.1)

## 2024-08-30 LAB — BLOOD GAS, VENOUS
Bicarbonate: 14.3 mmol/L — ABNORMAL LOW (ref 20.0–28.0)
Patient temperature: 37 mmol/L — AB (ref 0.0–2.0)
pCO2, Ven: 41 mmHg — ABNORMAL LOW (ref 44–60)
pH, Ven: 7.15 — CL (ref 7.25–7.43)
pO2, Ven: 14.3 mmol/L — AB (ref 32–45)

## 2024-08-30 LAB — IRON AND TIBC
Iron: <10 ug/dL — ABNORMAL LOW (ref 45–182)
TIBC: 209 ug/dL — ABNORMAL LOW (ref 250–450)

## 2024-08-30 LAB — FERRITIN: Ferritin: 340 ng/mL — ABNORMAL HIGH (ref 24–336)

## 2024-08-30 LAB — TROPONIN I (HIGH SENSITIVITY): Troponin I (High Sensitivity): 338 ng/L (ref <18)

## 2024-08-30 LAB — HEPARIN LEVEL (UNFRACTIONATED): Heparin Unfractionated: 0.38 [IU]/mL (ref 0.30–0.70)

## 2024-08-30 LAB — VITAMIN B12: Vitamin B-12: 620 pg/mL (ref 180–914)

## 2024-08-30 MED ORDER — IPRATROPIUM-ALBUTEROL 0.5-2.5 (3) MG/3ML IN SOLN: 3.0000 mL | RESPIRATORY_TRACT | Status: DC | PRN

## 2024-08-30 MED ORDER — OSELTAMIVIR PHOSPHATE 30 MG PO CAPS
30.0000 mg | ORAL_CAPSULE | Freq: Every day | ORAL | Status: AC
  Administered 2024-09-02 – 2024-09-03 (×2): 30 mg via ORAL
  Filled 2024-08-30 (×4): qty 1

## 2024-08-30 NOTE — Consult Note (Signed)
 [facility]  CONSULT NOTE    Date: 08/30/2024                  Patient Name:  Adrian Michael  MRN: [id number]  DOB: May 17, 1935  Age / Sex: 89 y.o., male         PCP: Godfrey Area, MD                 Service Requesting Consult: TRH                 Reason for Consult: Acute kidney injury            History of Present Illness: Mr. Adrian Michael is a 89 y.o.  male with past medical history of diabetes, hypertension, coronary artery disease, dementia, peripheral vascular disease, nephrolithiasis and chronic kidney disease, who was admitted to [facility] on 08/29/2024 for Influenzal pneumonia [J11.00] Influenza A [J10.1] Acute respiratory failure with hypoxia (HCC) [J96.01]  Patient present to ED with respiratory distress. He is seen at bedside no family present. Chairt review used to obtain history. Patient arrived to [facility] on Monday to stay with daughter and developed a fever. EmS arrived and noted decreased O2 sats, placed on NRB. He is seen laying in bed. ECHO in progress. 3L [place], no lower extremity edema.   Labs on ED arrival concerning for potassium 5.7, serum bicarb 13, BUN 143, creatinine 2.58 with GFR 23, BNP 413, troponin 322, lactic acid 2.0, and hemoglobin 9.8.  Respiratory panel positive for influenza A.  Chest x-ray shows mild patchy bilateral lower lobe opacities concerning for pneumonia.  CT head shows age-related changes.   Medications: Outpatient medications: Medications Prior to Admission  Medication Sig Dispense Refill Last Dose/Taking   allopurinol  (ZYLOPRIM ) 100 MG tablet Take 100 mg by mouth daily.      amLODipine  (NORVASC ) 5 MG tablet Take 5 mg by mouth daily.      CANNABIDIOL PO Take by mouth.      Cholecalciferol (VITAMIN D-3) 125 MCG (5000 UT) TABS Take by mouth.      donepezil  (ARICEPT ) 5 MG tablet Take 5 mg by mouth at bedtime.      Ginkgo Biloba 40 MG TABS Take by mouth.      Misc Natural Products (OSTEO BI-FLEX TRIPLE STRENGTH PO) Take  by mouth.      Multiple Vitamins-Minerals (CENTRUM SILVER 50+MEN) TABS Take by mouth.      tamsulosin  (FLOMAX ) 0.4 MG CAPS capsule Take 0.4 mg by mouth.      triamcinolone cream (KENALOG) 0.1 % Apply 1 application topically 2 (two) times daily.       Current medications: Current Facility-Administered Medications  Medication Dose Route Frequency Provider Last Rate Last Admin   acetaminophen  (TYLENOL ) tablet 650 mg  650 mg Oral Q6H PRN Mansy, Jan A, MD       Or   acetaminophen  (TYLENOL ) suppository 650 mg  650 mg Rectal Q6H PRN Mansy, Jan A, MD       allopurinol  (ZYLOPRIM ) tablet 100 mg  100 mg Oral Daily Mansy, Jan A, MD   100 mg at 08/30/24 0953   amLODipine  (NORVASC ) tablet 5 mg  5 mg Oral Daily Mansy, Jan A, MD   5 mg at 08/30/24 9046   azithromycin  (ZITHROMAX ) 500 mg in sodium chloride  0.9 % 250 mL IVPB  500 mg Intravenous Q24H Mansy, Jan A, MD       cefTRIAXone  (ROCEPHIN ) 2 g in sodium chloride  0.9 % 100 mL  IVPB  2 g Intravenous Q24H Mansy, Jan A, MD       chlorpheniramine-HYDROcodone (TUSSIONEX) 10-8 MG/5ML suspension 5 mL  5 mL Oral Q12H PRN Mansy, Jan A, MD       donepezil  (ARICEPT ) tablet 5 mg  5 mg Oral QHS Mansy, Jan A, MD   5 mg at 08/29/24 2315   guaiFENesin  (MUCINEX ) 12 hr tablet 600 mg  600 mg Oral BID Mansy, Jan A, MD   600 mg at 08/30/24 9046   heparin  ADULT infusion 100 units/mL (25000 units/250mL)  750 Units/hr Intravenous Continuous Haynes Annabella SAILOR, RPH 7.5 mL/hr at 08/30/24 0509 750 Units/hr at 08/30/24 0509   ipratropium-albuterol  (DUONEB) 0.5-2.5 (3) MG/3ML nebulizer solution 3 mL  3 mL Nebulization Q4H PRN Jens Durand, MD       magnesium  hydroxide (MILK OF MAGNESIA) suspension 30 mL  30 mL Oral Daily PRN Mansy, Jan A, MD       ondansetron  (ZOFRAN ) tablet 4 mg  4 mg Oral Q6H PRN Mansy, Jan A, MD       Or   ondansetron  (ZOFRAN ) injection 4 mg  4 mg Intravenous Q6H PRN Mansy, Jan A, MD       oseltamivir  (TAMIFLU ) capsule 75 mg  75 mg Oral BID Mansy, Jan A, MD   75  mg at 08/30/24 9046   sodium bicarbonate  150 mEq in sterile water  1,150 mL infusion   Intravenous Continuous Nazari, Walid A, RPH   Stopped at 08/30/24 0454   tamsulosin  (FLOMAX ) capsule 0.4 mg  0.4 mg Oral QPC supper Mansy, Jan A, MD       traZODone  (DESYREL ) tablet 25 mg  25 mg Oral QHS PRN Mansy, Madison LABOR, MD          Allergies: No Known Allergies    Past Medical History: History reviewed. No pertinent past medical history.   Past Surgical History: History reviewed. No pertinent surgical history.   Family History: History reviewed. No pertinent family history.   Social History: Social History   Socioeconomic History   Marital status: Married    Spouse name: Not on file   Number of children: Not on file   Years of education: Not on file   Highest education level: Not on file  Occupational History   Not on file  Tobacco Use   Smoking status: Not on file   Smokeless tobacco: Not on file  Substance and Sexual Activity   Alcohol use: Not on file   Drug use: Not on file   Sexual activity: Not on file  Other Topics Concern   Not on file  Social History Narrative   Not on file   Social Drivers of Health   Financial Resource Strain: Not on file  Food Insecurity: No Food Insecurity (08/30/2024)   Hunger Vital Sign    Worried About Running Out of Food in the Last Year: Never true    Ran Out of Food in the Last Year: Never true  Transportation Needs: No Transportation Needs (08/30/2024)   PRAPARE - Administrator, Civil Service (Medical): No    Lack of Transportation (Non-Medical): No  Physical Activity: Not on file  Stress: Not on file  Social Connections: Patient Unable To Answer (08/30/2024)   Social Connection and Isolation Panel    Frequency of Communication with Friends and Family: Patient unable to answer    Frequency of Social Gatherings with Friends and Family: Patient unable to answer    Attends Religious Services:  Patient unable to answer    Active  Member of Clubs or Organizations: Patient unable to answer    Attends Club or Organization Meetings: Patient unable to answer    Marital Status: Patient unable to answer  Intimate Partner Violence: Not At Risk (08/30/2024)   Humiliation, Afraid, Rape, and Kick questionnaire    Fear of Current or Ex-Partner: No    Emotionally Abused: No    Physically Abused: No    Sexually Abused: No     Review of Systems: Review of Systems  Unable to perform ROS: Dementia    Vital Signs: Blood pressure (!) 122/56, pulse 65, temperature 98.4 F (36.9 C), resp. rate 20, height 5' 7 (1.702 m), weight 61.2 kg, SpO2 96%.  Weight trends: Filed Weights   08/29/24 1805 08/29/24 2112  Weight: 61.2 kg 61.2 kg    Physical Exam: General: NAD  Head: Normocephalic, atraumatic. Moist oral mucosal membranes  Eyes: Anicteric  Neck: Supple  Lungs:  Clear to auscultation, [place] O2  Heart: Regular rate and rhythm  Abdomen:  Soft, nontender  Extremities: No peripheral edema.  Neurologic: Awake, alert, nonverbal  Skin: No lesions        Lab results: Basic Metabolic Panel: Recent Labs  Lab 08/29/24 1801 08/30/24 0458  NA 141 141  K 5.7* 5.1  CL 117* 114*  CO2 13* 17*  GLUCOSE 252* 133*  BUN 143* 118*  CREATININE 2.58* 2.39*  CALCIUM  8.5* 8.5*    Liver Function Tests: Recent Labs  Lab 08/29/24 1801 08/30/24 0458  AST 72* 69*  ALT 69* 57*  ALKPHOS 107 80  BILITOT 0.4 0.5  PROT 6.4* 5.1*  ALBUMIN 3.2* 2.5*   No results for input(s): LIPASE, AMYLASE in the last 168 hours. No results for input(s): AMMONIA in the last 168 hours.  CBC: Recent Labs  Lab 08/29/24 1801 08/30/24 0458  WBC 5.6 7.5  NEUTROABS 4.0  --   HGB 9.8* 8.8*  HCT 31.1* 27.2*  MCV 107.2* 103.0*  PLT 220 211    Cardiac Enzymes: No results for input(s): CKTOTAL, CKMB, CKMBINDEX, TROPONINI in the last 168 hours.  BNP: Invalid input(s): POCBNP  CBG: No results for input(s): GLUCAP in the last  168 hours.  Microbiology: Results for orders placed or performed during the hospital encounter of 08/29/24  Culture, blood (routine x 2)     Status: None (Preliminary result)   Collection Time: 08/29/24  6:00 PM   Specimen: BLOOD  Result Value Ref Range Status   Specimen Description BLOOD BLOOD LEFT FOREARM  Final   Special Requests   Final    BOTTLES DRAWN AEROBIC AND ANAEROBIC Blood Culture results may not be optimal due to an inadequate volume of blood received in culture bottles   Culture   Final    NO GROWTH < 12 HOURS Performed at [facility], [address], [place], [place] [address]    Report Status PENDING  Incomplete  Culture, blood (routine x 2)     Status: None (Preliminary result)   Collection Time: 08/29/24  6:00 PM   Specimen: BLOOD  Result Value Ref Range Status   Specimen Description BLOOD BLOOD RIGHT FOREARM  Final   Special Requests   Final    BOTTLES DRAWN AEROBIC AND ANAEROBIC Blood Culture results may not be optimal due to an inadequate volume of blood received in culture bottles   Culture   Final    NO GROWTH < 12 HOURS Performed at [facility],  [address], [place], [place] [address]    Report Status PENDING  Incomplete  Resp panel by RT-PCR (RSV, Flu A&B, Covid) Anterior Nasal Swab     Status: Abnormal   Collection Time: 08/29/24  6:01 PM   Specimen: Anterior Nasal Swab  Result Value Ref Range Status   SARS Coronavirus 2 by RT PCR NEGATIVE NEGATIVE Final    Comment: (NOTE) SARS-CoV-2 target nucleic acids are NOT DETECTED.  The SARS-CoV-2 RNA is generally detectable in upper respiratory specimens during the acute phase of infection. The lowest concentration of SARS-CoV-2 viral copies this assay can detect is 138 copies/mL. A negative result does not preclude SARS-Cov-2 infection and should not be used as the sole basis for treatment or other patient management decisions. A negative result may occur with  improper specimen  collection/handling, submission of specimen other than nasopharyngeal swab, presence of viral mutation(s) within the areas targeted by this assay, and inadequate number of viral copies(<138 copies/mL). A negative result must be combined with clinical observations, patient history, and epidemiological information. The expected result is Negative.  Fact Sheet for Patients:  BloggerCourse.com  Fact Sheet for Healthcare Providers:  SeriousBroker.it  This test is no t yet approved or cleared by the [place]  FDA and  has been authorized for detection and/or diagnosis of SARS-CoV-2 by FDA under an Emergency Use Authorization (EUA). This EUA will remain  in effect (meaning this test can be used) for the duration of the COVID-19 declaration under Section 564(b)(1) of the Act, 21 U.S.C.section 360bbb-3(b)(1), unless the authorization is terminated  or revoked sooner.       Influenza A by PCR POSITIVE (A) NEGATIVE Final   Influenza B by PCR NEGATIVE NEGATIVE Final    Comment: (NOTE) The Xpert Xpress SARS-CoV-2/FLU/RSV plus assay is intended as an aid in the diagnosis of influenza from Nasopharyngeal swab specimens and should not be used as a sole basis for treatment. Nasal washings and aspirates are unacceptable for Xpert Xpress SARS-CoV-2/FLU/RSV testing.  Fact Sheet for Patients: BloggerCourse.com  Fact Sheet for Healthcare Providers: SeriousBroker.it  This test is not yet approved or cleared by the [place]  FDA and has been authorized for detection and/or diagnosis of SARS-CoV-2 by FDA under an Emergency Use Authorization (EUA). This EUA will remain in effect (meaning this test can be used) for the duration of the COVID-19 declaration under Section 564(b)(1) of the Act, 21 U.S.C. section 360bbb-3(b)(1), unless the authorization is terminated or revoked.     Resp Syncytial  Virus by PCR NEGATIVE NEGATIVE Final    Comment: (NOTE) Fact Sheet for Patients: BloggerCourse.com  Fact Sheet for Healthcare Providers: SeriousBroker.it  This test is not yet approved or cleared by the [place]  FDA and has been authorized for detection and/or diagnosis of SARS-CoV-2 by FDA under an Emergency Use Authorization (EUA). This EUA will remain in effect (meaning this test can be used) for the duration of the COVID-19 declaration under Section 564(b)(1) of the Act, 21 U.S.C. section 360bbb-3(b)(1), unless the authorization is terminated or revoked.  Performed at [facility], [address] [place], [place] [address]     Coagulation Studies: Recent Labs    08/29/24 10/04/2156  LABPROT 14.8  INR 1.1    Urinalysis: Recent Labs    08/29/24 October 04, 2101  COLORURINE YELLOW*  LABSPEC 1.014  PHURINE 5.0  GLUCOSEU NEGATIVE  HGBUR NEGATIVE  BILIRUBINUR NEGATIVE  KETONESUR NEGATIVE  PROTEINUR 30*  NITRITE NEGATIVE  LEUKOCYTESUR NEGATIVE  Imaging: ECHOCARDIOGRAM COMPLETE Result Date: 08/30/2024    ECHOCARDIOGRAM REPORT   Patient Name:   ALEXX MCBURNEY Date of Exam: 08/30/2024 Medical Rec #:  [id number]   Height:       67.0 in Accession #:    [id number]  Weight:       134.9 lb Date of Birth:  [birth year]   BSA:          1.711 m Patient Age:    89 years    BP:           134/58 mmHg Patient Gender: M           HR:           73 bpm. Exam Location:  [facility] Procedure: 2D Echo, Cardiac Doppler, Color Doppler and Strain Analysis (Both            Spectral and Color Flow Doppler were utilized during procedure). Indications:     Elevated Troponin  History:         Patient has no prior history of Echocardiogram examinations.  Sonographer:     Thedora Louder RDCS, FASE Referring Phys:  [id number] MADISON A MANSY Diagnosing Phys: Denyse Bathe  Sonographer Comments: Global longitudinal strain was attempted. Challenging study due to patient's low  acoustic windows. IMPRESSIONS  1. Left ventricular ejection fraction, by estimation, is 60 to 65%. The left ventricle has normal function. The left ventricle has no regional wall motion abnormalities. Left ventricular diastolic parameters were normal. The global longitudinal strain is normal.  2. Right ventricular systolic function is normal. The right ventricular size is normal.  3. The mitral valve is normal in structure. Mild mitral valve regurgitation. No evidence of mitral stenosis.  4. The aortic valve is normal in structure. Aortic valve regurgitation is mild to moderate. Aortic valve sclerosis/calcification is present, without any evidence of aortic stenosis.  5. The inferior vena cava is normal in size with greater than 50% respiratory variability, suggesting right atrial pressure of 3 mmHg. FINDINGS  Left Ventricle: Left ventricular ejection fraction, by estimation, is 60 to 65%. The left ventricle has normal function. The left ventricle has no regional wall motion abnormalities. Strain was performed and the global longitudinal strain is normal. The  left ventricular internal cavity size was normal in size. There is no left ventricular hypertrophy. Left ventricular diastolic parameters were normal. Right Ventricle: The right ventricular size is normal. No increase in right ventricular wall thickness. Right ventricular systolic function is normal. Left Atrium: Left atrial size was normal in size. Right Atrium: Right atrial size was normal in size. Pericardium: There is no evidence of pericardial effusion. Mitral Valve: The mitral valve is normal in structure. Mild mitral valve regurgitation. No evidence of mitral valve stenosis. Tricuspid Valve: The tricuspid valve is normal in structure. Tricuspid valve regurgitation is trivial. No evidence of tricuspid stenosis. Aortic Valve: The aortic valve is normal in structure. Aortic valve regurgitation is mild to moderate. Aortic regurgitation PHT measures 810  msec. Aortic valve sclerosis/calcification is present, without any evidence of aortic stenosis. Aortic valve peak  gradient measures 5.7 mmHg. Pulmonic Valve: The pulmonic valve was normal in structure. Pulmonic valve regurgitation is not visualized. No evidence of pulmonic stenosis. Aorta: The aortic root is normal in size and structure. Venous: The inferior vena cava is normal in size with greater than 50% respiratory variability, suggesting right atrial pressure of 3 mmHg. IAS/Shunts: No atrial level shunt detected by color flow Doppler. Additional Comments: 3D was performed not  requiring image post processing on an independent workstation and was indeterminate.  LEFT VENTRICLE PLAX 2D LVIDd:         4.40 cm   Diastology LVIDs:         3.10 cm   LV e' medial:    6.85 cm/s LV PW:         1.30 cm   LV E/e' medial:  8.7 LV IVS:        1.20 cm   LV e' lateral:   6.74 cm/s LVOT diam:     2.40 cm   LV E/e' lateral: 8.9 LV SV:         95 LV SV Index:   55 LVOT Area:     4.52 cm  RIGHT VENTRICLE RV Basal diam:  3.70 cm RV S prime:     14.80 cm/s TAPSE (M-mode): 2.0 cm LEFT ATRIUM             Index        RIGHT ATRIUM           Index LA diam:        3.40 cm 1.99 cm/m   RA Area:     16.80 cm LA Vol (A2C):   36.7 ml 21.45 ml/m  RA Volume:   45.00 ml  26.30 ml/m LA Vol (A4C):   27.6 ml 16.13 ml/m LA Biplane Vol: 32.1 ml 18.76 ml/m  AORTIC VALVE                 PULMONIC VALVE AV Area (Vmax): 3.34 cm     PV Vmax:          1.15 m/s AV Vmax:        119.00 cm/s  PV Peak grad:     5.3 mmHg AV Peak Grad:   5.7 mmHg     PR End Diast Vel: 5.57 msec LVOT Vmax:      87.80 cm/s   RVOT Peak grad:   3 mmHg LVOT Vmean:     61.200 cm/s LVOT VTI:       0.209 m AI PHT:         810 msec  AORTA Ao Root diam: 3.70 cm MITRAL VALVE               TRICUSPID VALVE MV Area (PHT): 3.08 cm    TR Peak grad:   30.2 mmHg MV Decel Time: 246 msec    TR Vmax:        275.00 cm/s MV E velocity: 59.70 cm/s MV A velocity: 59.70 cm/s  SHUNTS MV E/A ratio:   1.00        Systemic VTI:  0.21 m                            Systemic Diam: 2.40 cm [employer] Electronically signed by Denyse Bathe Signature Date/Time: 08/30/2024/10:59:50 AM    Final    CT Head Wo Contrast Result Date: 08/29/2024 CLINICAL DATA:  Mental status change, unknown cause EXAM: CT HEAD WITHOUT CONTRAST TECHNIQUE: Contiguous axial images were obtained from the base of the skull through the vertex without intravenous contrast. RADIATION DOSE REDUCTION: This exam was performed according to the departmental dose-optimization program which includes automated exposure control, adjustment of the mA and/or kV according to patient size and/or use of iterative reconstruction technique. COMPARISON:  None Available. FINDINGS: Brain: There is periventricular white matter decreased attenuation consistent with small vessel  ischemic changes. Ventricles, sulci and cisterns are prominent consistent with age related involutional changes. No acute intracranial hemorrhage, mass effect or shift. No hydrocephalus. Vascular: No hyperdense vessel or unexpected calcification. Skull: Normal. Negative for fracture or focal lesion. Sinuses/Orbits: No acute finding. IMPRESSION: Atrophy and chronic small vessel ischemic changes. No acute intracranial process identified. Electronically Signed   By: Fonda Field M.D.   On: 08/29/2024 18:39   DG Chest Port 1 View Result Date: 08/29/2024 CLINICAL DATA:  Shortness of breath EXAM: PORTABLE CHEST 1 VIEW COMPARISON:  None Available. FINDINGS: There are mild patchy airspace opacities in both lower lobes. There is no pleural effusion or pneumothorax. Cardiomediastinal silhouette demonstrates mild cardiomegaly. There is no pneumothorax or acute fracture. IMPRESSION: Mild patchy airspace opacities in both lower lobes, concerning for pneumonia. Follow-up PA and lateral chest x-ray recommended in 4-6 weeks to confirm resolution. Electronically Signed   By: Greig Pique M.D.   On:  08/29/2024 18:28     Assessment & Plan: Mr. Abdishakur Gottschall is a 89 y.o.  male with past medical history of a fib, vascular demential, diabetes, and hypertension, who was admitted to [facility] on 08/29/2024 for Influenzal pneumonia [J11.00] Influenza A [J10.1] Acute respiratory failure with hypoxia (HCC) [J96.01]   Acute kidney injury on chronic kidney disease stage IIIb.  Most recent outpatient creatinine 1.80 with GFR 34 on 04/10/2021.  Patient was lost to follow-up.  Creatinine 2.58 on admission.  Agree with current measures, IV fluids.  Concerned that due to dementia and comorbidities, patient will make a poor dialysis candidate long-term.  Will continue to follow patient and support efforts of renal recovery.  2.  Acute metabolic acidosis.  Serum bicarb 13 on admission.  Continue IV supplementation.  3.  Hypertension with chronic kidney disease.  Home regimen includes losartan and amlodipine .  Receiving amlodipine  only.  Blood pressure 116/55.  4. Anemia of chronic kidney disease Lab Results  Component Value Date   HGB 8.8 (L) 08/30/2024    Hemoglobin 8.8, slightly decreased.  Will continue to monitor for now.  5. Diabetes mellitus type II with chronic kidney disease/renal manifestations: insulin dependent. Home regimen includes Metformin.   LOS: 1 Telitha Plath 9/1/202511:08 AM

## 2024-08-30 NOTE — Evaluation (Signed)
 Occupational Therapy Evaluation Patient Details Name: Adrian Michael MRN: [id number] DOB: May 19, 1935 Today's Date: 08/30/2024   History of Present Illness   Pt is an 89 y.o. male presenting to hospital 08/29/24 with c/o respiratory distress (SOB and weakness).   Pt was living in [place] (recently admitted to ICU and discharged to rehab faclity) and came to [facility] about 6 days prior to admission.  Pt admitted with influenza PNA, AKI superimposed on CKD, elevated troponin level (likely d/t demand ischemia).  PMH includes advanced dementia, CKD, a-fib, htn, DM, gout.     Clinical Impressions Upon arrival patient up in chair reclined.  Daughter present.  Patient has been living with her in the last 6 to 7 days since moving him from [place].  Since moving him patient was dependent in bathing and dressing as well as max assist for grooming.  Patient can be independent in eating if he likes the food.  But mostly drinking boost.  Was living with daughter and son-in-law on the same level two-story house.  Patient is max assist for pivot transfer between bed and chair.  Appear patient was able to walk a few steps with a rolling walker in rehab prior to coming to [place] .  Was before that moving around in wheelchair.  Sitting edge of bed patient leaning backwards.  But does better when feet is blocked preventing sliding and cueing to sit up.  Needs support upper and lower extremities.  Bed mobility to assist with ADLs with min assist with verbal cueing.  Pt's daughter reports limited ability to assist pt physically and is returning to work later this week after medical leave.  She does have a 12-hour care if needed but patient would benefit from continued therapy in a skilled rehab environment as well as a possible transition to memory care facility patient can benefit from skilled OT services in the setting to improve sitting balance and functional transfers for ADLs.  Upon hospital discharge, pt would benefit from  ongoing OT services.      If plan is discharge home, recommend the following:         Functional Status Assessment         Equipment Recommendations         Recommendations for Other Services         Precautions/Restrictions   Precautions Precautions: Fall Recall of Precautions/Restrictions: Impaired Restrictions Weight Bearing Restrictions Per Provider Order: No     Mobility Bed Mobility Overal bed mobility: Needs Assistance Bed Mobility: Sit to Supine     Supine to sit: Mod assist     General bed mobility comments: Can turn and scoot wtih v/c and t/c as well as gestures    Transfers Overall transfer level: Needs assistance Equipment used: None Transfers: Bed to chair/wheelchair/BSC   Stand pivot transfers: Max assist         General transfer comment: stand pivot recliner to bed ti to Left with max assist x1; vc's for technique;      Balance Overall balance assessment: Needs assistance Sitting-balance support: No upper extremity supported, Feet supported Sitting balance-Leahy Scale: Poor Sitting balance - Comments: need to block feet from sliding and cueing to stay sitting up Postural control: Posterior lean                                 ADL either performed or assessed with clinical judgement   ADL Overall ADL's :  At baseline                                       General ADL Comments: Dep to Max for bathing and dressing, depends toileting , Grooming Max - Feeding himself if he likes to food -     Vision Patient Visual Report: No change from baseline       Perception         Praxis         Pertinent Vitals/Pain Pain Assessment Pain Score: 0-No pain     Extremity/Trunk Assessment Upper Extremity Assessment Upper Extremity Assessment: Right hand dominant   Lower Extremity Assessment Lower Extremity Assessment: Generalized weakness;Overall [facility] for tasks assessed   Cervical / Trunk  Assessment Cervical / Trunk Assessment: Other exceptions Cervical / Trunk Exceptions: forward head/shoulders   Communication Communication Factors Affecting Communication: Hearing impaired   Cognition Arousal: Alert Behavior During Therapy: Flat affect                                 Following commands: Impaired Following commands impaired: Follows one step commands inconsistently     Cueing  General Comments   Cueing Techniques: Verbal cues;Tactile cues;Visual cues  Pt's bed and gown noted to be wet; new gown placed and nurse notified of need to check to make sure purewick working; bed linens changed by pt's daughter (who is a Engineer, civil (consulting))   Exercises     Shoulder Instructions      Home Living Family/patient expects to be discharged to:: Private residence Living Arrangements: Children Available Help at Discharge: Family;Personal care attendant Type of Home: House Home Access: Ramped entrance     Home Layout: Two level;Able to live on main level with bedroom/bathroom     Bathroom Shower/Tub: Producer, television/film/video: Handicapped height     Home Equipment: Rollator (4 wheels);Wheelchair - manual;BSC/3in1;Other (comment);Grab bars - tub/shower;Shower seat;Shower seat - built in          Prior Functioning/Environment Prior Level of Function : Needs assist             Mobility Comments: Pt's daughter reports pt had been walking 3-4 steps with rollator (assist to stand up to walker though) but otherwise using w/c when she visited pt in November 2024; daughter reports decline in status since then and was requiring assist with transfers (w/c level) since April; per report daughter received pt was walking about 20 feet with assist during recent rehab stay; since coming to [place] pt has required assist with transfers w/c level (pt's daughter unable to assist pt d/t shoulder surgery--going back to work soon) ADLs Comments: Has 12 hour caregiver support 7 days  a week (currently on hold d/t hospitalization).  Pt was able to pull pants up with support in standing. Bed baths, and will feed self if likes food- Max bathing and dressing    OT Problem List: Decreased safety awareness;Decreased activity tolerance;Impaired balance (sitting and/or standing)   OT Treatment/Interventions: Self-care/ADL training;Neuromuscular education;Balance training;Patient/family education      OT Goals(Current goals can be found in the care plan section)   Acute Rehab OT Goals Patient Stated Goal: Doing to rehab and memory care OT Goal Formulation: With family Time For Goal Achievement: 09/13/24 Potential to Achieve Goals: Fair   OT Frequency:  Min 1X/week  Co-evaluation              AM-PAC OT 6 Clicks Daily Activity     Outcome Measure Help from another person eating meals?: A Lot Help from another person taking care of personal grooming?: A Lot Help from another person toileting, which includes using toliet, bedpan, or urinal?: Total Help from another person bathing (including washing, rinsing, drying)?: Total Help from another person to put on and taking off regular upper body clothing?: Total Help from another person to put on and taking off regular lower body clothing?: Total 6 Click Score: 8   End of Session Equipment Utilized During Treatment: Gait belt  Activity Tolerance: Patient tolerated treatment well Patient left: in bed;with family/visitor present  OT Visit Diagnosis: Unsteadiness on feet (R26.81);Muscle weakness (generalized) (M62.81);Other symptoms and signs involving cognitive function                Time: 1442-1511 OT Time Calculation (min): 29 min Charges:  OT General Charges $OT Visit: 1 Visit OT Evaluation $OT Eval Low Complexity: 1 Low    Chonita Gadea OTR/L,CLT 08/30/2024, 3:17 PM

## 2024-08-30 NOTE — Consult Note (Signed)
 Adrian Michael is a 89 y.o. male  [id number]  Primary Cardiologist: [facility] cardiology Reason for Consultation: Elevated troponin  HPI: This is a 89 year old white male with a past medical history of hypertension, atrial fibrillation mixed Alzheimer and vascular dementia presented to the emergency room with worsening shortness of breath and generalized weakness.  Patient is a poor historian due to dementia and most of the history was given by the daughter in the emergency room.  According to the daughter patient has been coughing and been very short of breath for the past 6 days.  He also has not been eating properly.  He had a fever of 101 and was found to be positive for influenza.  I was asked to evaluate the patient because of elevated troponin.   Review of Systems: Patient appears to be comfortable denies any chest pain   History reviewed. No pertinent past medical history.  Medications Prior to Admission  Medication Sig Dispense Refill   allopurinol  (ZYLOPRIM ) 100 MG tablet Take 100 mg by mouth daily.     atenolol (TENORMIN) 25 MG tablet Take 25 mg by mouth daily.     Cholecalciferol (VITAMIN D-3) 125 MCG (5000 UT) TABS Take by mouth.     donepezil  (ARICEPT ) 10 MG tablet Take 10 mg by mouth daily.     Ginkgo Biloba 40 MG TABS Take by mouth.     levothyroxine  (SYNTHROID ) 50 MCG tablet Take 50 mcg by mouth daily.     losartan (COZAAR) 100 MG tablet Take 100 mg by mouth daily.     memantine  (NAMENDA ) 5 MG tablet Take by mouth.     metFORMIN (GLUCOPHAGE-XR) 750 MG 24 hr tablet Take 750 mg by mouth every evening. With dinner     mirtazapine  (REMERON ) 7.5 MG tablet Take 7.5 mg by mouth at bedtime.     Misc Natural Products (OSTEO BI-FLEX TRIPLE STRENGTH PO) Take by mouth.     Multiple Vitamins-Minerals (CENTRUM SILVER 50+MEN) TABS Take by mouth.     simvastatin  (ZOCOR ) 40 MG tablet Take 40 mg by mouth at bedtime.     tamsulosin  (FLOMAX ) 0.4 MG CAPS capsule Take 0.4 mg by mouth.      triamcinolone cream (KENALOG) 0.1 % Apply 1 application topically 2 (two) times daily.     amLODipine  (NORVASC ) 5 MG tablet Take 5 mg by mouth daily.     CANNABIDIOL PO Take by mouth.        allopurinol   100 mg Oral Daily   amLODipine   5 mg Oral Daily   donepezil   5 mg Oral QHS   guaiFENesin   600 mg Oral BID   oseltamivir   75 mg Oral BID   tamsulosin   0.4 mg Oral QPC supper    Infusions:  azithromycin      cefTRIAXone  (ROCEPHIN )  IV     sodium bicarbonate  150 mEq in sterile water  1,150 mL infusion 125 mL/hr at 08/30/24 1152    No Known Allergies  Social History   Socioeconomic History   Marital status: Married    Spouse name: Not on file   Number of children: Not on file   Years of education: Not on file   Highest education level: Not on file  Occupational History   Not on file  Tobacco Use   Smoking status: Not on file   Smokeless tobacco: Not on file  Substance and Sexual Activity   Alcohol use: Not on file   Drug use: Not on file   Sexual  activity: Not on file  Other Topics Concern   Not on file  Social History Narrative   Not on file   Social Drivers of Health   Financial Resource Strain: Not on file  Food Insecurity: No Food Insecurity (08/30/2024)   Hunger Vital Sign    Worried About Running Out of Food in the Last Year: Never true    Ran Out of Food in the Last Year: Never true  Transportation Needs: No Transportation Needs (08/30/2024)   PRAPARE - Administrator, Civil Service (Medical): No    Lack of Transportation (Non-Medical): No  Physical Activity: Not on file  Stress: Not on file  Social Connections: Patient Unable To Answer (08/30/2024)   Social Connection and Isolation Panel    Frequency of Communication with Friends and Family: Patient unable to answer    Frequency of Social Gatherings with Friends and Family: Patient unable to answer    Attends Religious Services: Patient unable to answer    Active Member of Clubs or Organizations:  Patient unable to answer    Attends Banker Meetings: Patient unable to answer    Marital Status: Patient unable to answer  Intimate Partner Violence: Not At Risk (08/30/2024)   Humiliation, Afraid, Rape, and Kick questionnaire    Fear of Current or Ex-Partner: No    Emotionally Abused: No    Physically Abused: No    Sexually Abused: No    History reviewed. No pertinent family history.  PHYSICAL EXAM: Vitals:   08/30/24 0729 08/30/24 1120  BP: (!) 122/56 (!) 116/55  Pulse: 65 75  Resp: 20 20  Temp: 98.4 F (36.9 C) 98.6 F (37 C)  SpO2: 96% 97%     Intake/Output Summary (Last 24 hours) at 08/30/2024 1246 Last data filed at 08/30/2024 0509 Gross per 24 hour  Intake 1750.26 ml  Output 600 ml  Net 1150.26 ml    General:  Well appearing. No respiratory difficulty HEENT: normal Neck: supple. no JVD. Carotids 2+ bilat; no bruits. No lymphadenopathy or thryomegaly appreciated. Cor: PMI nondisplaced. Regular rate & rhythm. No rubs, gallops or murmurs. Lungs: clear Abdomen: soft, nontender, nondistended. No hepatosplenomegaly. No bruits or masses. Good bowel sounds. Extremities: no cyanosis, clubbing, rash, edema Neuro: alert & oriented x 3, cranial nerves grossly intact. moves all 4 extremities w/o difficulty. Affect pleasant.  ECG: Atrial fibrillation with ventricular rate 79 bpm poor R wave progression left anterior fascicular block  Results for orders placed or performed during the hospital encounter of 08/29/24 (from the past 24 hours)  Culture, blood (routine x 2)     Status: None (Preliminary result)   Collection Time: 08/29/24  6:00 PM   Specimen: BLOOD  Result Value Ref Range   Specimen Description BLOOD BLOOD LEFT FOREARM    Special Requests      BOTTLES DRAWN AEROBIC AND ANAEROBIC Blood Culture results may not be optimal due to an inadequate volume of blood received in culture bottles   Culture      NO GROWTH < 12 HOURS Performed at [facility], [address], [place], [place] [address]    Report Status PENDING   Culture, blood (routine x 2)     Status: None (Preliminary result)   Collection Time: 08/29/24  6:00 PM   Specimen: BLOOD  Result Value Ref Range   Specimen Description BLOOD BLOOD RIGHT FOREARM    Special Requests      BOTTLES DRAWN AEROBIC AND ANAEROBIC Blood  Culture results may not be optimal due to an inadequate volume of blood received in culture bottles   Culture      NO GROWTH < 12 HOURS Performed at [facility], [address] [place], [place] [address]    Report Status PENDING   Comprehensive metabolic panel     Status: Abnormal   Collection Time: 08/29/24  6:01 PM  Result Value Ref Range   Sodium 141 135 - 145 mmol/L   Potassium 5.7 (H) 3.5 - 5.1 mmol/L   Chloride 117 (H) 98 - 111 mmol/L   CO2 13 (L) 22 - 32 mmol/L   Glucose, Bld 252 (H) 70 - 99 mg/dL   BUN 856 (H) 8 - 23 mg/dL   Creatinine, Ser 7.41 (H) 0.61 - 1.24 mg/dL   Calcium  8.5 (L) 8.9 - 10.3 mg/dL   Total Protein 6.4 (L) 6.5 - 8.1 g/dL   Albumin 3.2 (L) 3.5 - 5.0 g/dL   AST 72 (H) 15 - 41 U/L   ALT 69 (H) 0 - 44 U/L   Alkaline Phosphatase 107 38 - 126 U/L   Total Bilirubin 0.4 0.0 - 1.2 mg/dL   GFR, Estimated 23 (L) >60 mL/min   Anion gap 11 5 - 15  Troponin I (High Sensitivity)     Status: Abnormal   Collection Time: 08/29/24  6:01 PM  Result Value Ref Range   Troponin I (High Sensitivity) 322 (HH) <18 ng/L  Brain natriuretic peptide     Status: Abnormal   Collection Time: 08/29/24  6:01 PM  Result Value Ref Range   B Natriuretic Peptide 413.4 (H) 0.0 - 100.0 pg/mL  CBC with Differential     Status: Abnormal   Collection Time: 08/29/24  6:01 PM  Result Value Ref Range   WBC 5.6 4.0 - 10.5 K/uL   RBC 2.90 (L) 4.22 - 5.81 MIL/uL   Hemoglobin 9.8 (L) 13.0 - 17.0 g/dL   HCT 68.8 (L) 60.9 - 47.9 %   MCV 107.2 (H) 80.0 - 100.0 fL   MCH 33.8 26.0 - 34.0 pg   MCHC 31.5 30.0 - 36.0 g/dL   RDW 85.3 88.4 - 84.4 %    Platelets 220 150 - 400 K/uL   nRBC 0.0 0.0 - 0.2 %   Neutrophils Relative % 72 %   Neutro Abs 4.0 1.7 - 7.7 K/uL   Lymphocytes Relative 22 %   Lymphs Abs 1.3 0.7 - 4.0 K/uL   Monocytes Relative 6 %   Monocytes Absolute 0.3 0.1 - 1.0 K/uL   Eosinophils Relative 0 %   Eosinophils Absolute 0.0 0.0 - 0.5 K/uL   Basophils Relative 0 %   Basophils Absolute 0.0 0.0 - 0.1 K/uL   WBC Morphology See Note    RBC Morphology MORPHOLOGY UNREMARKABLE    Smear Review Normal platelet morphology    Immature Granulocytes 0 %   Abs Immature Granulocytes 0.01 0.00 - 0.07 K/uL   Dohle Bodies PRESENT   Lactic acid, plasma     Status: Abnormal   Collection Time: 08/29/24  6:01 PM  Result Value Ref Range   Lactic Acid, Venous 2.0 (HH) 0.5 - 1.9 mmol/L  Resp panel by RT-PCR (RSV, Flu A&B, Covid) Anterior Nasal Swab     Status: Abnormal   Collection Time: 08/29/24  6:01 PM   Specimen: Anterior Nasal Swab  Result Value Ref Range   SARS Coronavirus 2 by RT PCR NEGATIVE NEGATIVE   Influenza A by PCR POSITIVE (  A) NEGATIVE   Influenza B by PCR NEGATIVE NEGATIVE   Resp Syncytial Virus by PCR NEGATIVE NEGATIVE  Blood gas, venous     Status: Abnormal (Preliminary result)   Collection Time: 08/29/24  6:03 PM  Result Value Ref Range   pH, Ven 7.15 (LL) 7.25 - 7.43   pCO2, Ven 41 (L) 44 - 60 mmHg   pO2, Ven PENDING 32 - 45 mmHg   Bicarbonate 14.3 (L) 20.0 - 28.0 mmol/L   Acid-base deficit 14.0 (H) 0.0 - 2.0 mmol/L   O2 Saturation PENDING %   Patient temperature 37.0    Collection site VEIN   Blood gas, arterial     Status: Abnormal   Collection Time: 08/29/24  7:30 PM  Result Value Ref Range   O2 Content 10.0 L/min   Delivery systems HI FLOW NASAL CANNULA    pH, Arterial 7.32 (L) 7.35 - 7.45   pCO2 arterial 26 (L) 32 - 48 mmHg   pO2, Arterial 100 83 - 108 mmHg   Bicarbonate 13.4 (L) 20.0 - 28.0 mmol/L   Acid-base deficit 11.0 (H) 0.0 - 2.0 mmol/L   O2 Saturation 99.6 %   Patient temperature 37.0     Collection site LEFT RADIAL    Allens test (pass/fail) PASS PASS  Lactic acid, plasma     Status: None   Collection Time: 08/29/24  9:02 PM  Result Value Ref Range   Lactic Acid, Venous 1.9 0.5 - 1.9 mmol/L  Urinalysis, Routine w reflex microscopic -Urine, Catheterized     Status: Abnormal   Collection Time: 08/29/24  9:02 PM  Result Value Ref Range   Color, Urine YELLOW (A) YELLOW   APPearance HAZY (A) CLEAR   Specific Gravity, Urine 1.014 1.005 - 1.030   pH 5.0 5.0 - 8.0   Glucose, UA NEGATIVE NEGATIVE mg/dL   Hgb urine dipstick NEGATIVE NEGATIVE   Bilirubin Urine NEGATIVE NEGATIVE   Ketones, ur NEGATIVE NEGATIVE mg/dL   Protein, ur 30 (A) NEGATIVE mg/dL   Nitrite NEGATIVE NEGATIVE   Leukocytes,Ua NEGATIVE NEGATIVE   RBC / HPF 0-5 0 - 5 RBC/hpf   WBC, UA 0-5 0 - 5 WBC/hpf   Bacteria, UA NONE SEEN NONE SEEN   Squamous Epithelial / HPF 0 0 - 5 /HPF  Troponin I (High Sensitivity)     Status: Abnormal   Collection Time: 08/29/24  9:02 PM  Result Value Ref Range   Troponin I (High Sensitivity) 328 (HH) <18 ng/L  Troponin I (High Sensitivity)     Status: Abnormal   Collection Time: 08/29/24  9:57 PM  Result Value Ref Range   Troponin I (High Sensitivity) 331 (HH) <18 ng/L  APTT     Status: Abnormal   Collection Time: 08/29/24  9:57 PM  Result Value Ref Range   aPTT 37 (H) 24 - 36 seconds  Heparin  level (unfractionated)     Status: Abnormal   Collection Time: 08/29/24  9:57 PM  Result Value Ref Range   Heparin  Unfractionated <0.10 (L) 0.30 - 0.70 IU/mL  Protime-INR     Status: None   Collection Time: 08/29/24  9:57 PM  Result Value Ref Range   Prothrombin Time 14.8 11.4 - 15.2 seconds   INR 1.1 0.8 - 1.2  Troponin I (High Sensitivity)     Status: Abnormal   Collection Time: 08/29/24 11:05 PM  Result Value Ref Range   Troponin I (High Sensitivity) 338 (HH) <18 ng/L  Comprehensive metabolic panel  Status: Abnormal   Collection Time: 08/30/24  4:58 AM  Result Value Ref  Range   Sodium 141 135 - 145 mmol/L   Potassium 5.1 3.5 - 5.1 mmol/L   Chloride 114 (H) 98 - 111 mmol/L   CO2 17 (L) 22 - 32 mmol/L   Glucose, Bld 133 (H) 70 - 99 mg/dL   BUN 881 (H) 8 - 23 mg/dL   Creatinine, Ser 7.60 (H) 0.61 - 1.24 mg/dL   Calcium  8.5 (L) 8.9 - 10.3 mg/dL   Total Protein 5.1 (L) 6.5 - 8.1 g/dL   Albumin 2.5 (L) 3.5 - 5.0 g/dL   AST 69 (H) 15 - 41 U/L   ALT 57 (H) 0 - 44 U/L   Alkaline Phosphatase 80 38 - 126 U/L   Total Bilirubin 0.5 0.0 - 1.2 mg/dL   GFR, Estimated 26 (L) >60 mL/min   Anion gap 10 5 - 15  CBC     Status: Abnormal   Collection Time: 08/30/24  4:58 AM  Result Value Ref Range   WBC 7.5 4.0 - 10.5 K/uL   RBC 2.64 (L) 4.22 - 5.81 MIL/uL   Hemoglobin 8.8 (L) 13.0 - 17.0 g/dL   HCT 72.7 (L) 60.9 - 47.9 %   MCV 103.0 (H) 80.0 - 100.0 fL   MCH 33.3 26.0 - 34.0 pg   MCHC 32.4 30.0 - 36.0 g/dL   RDW 85.6 88.4 - 84.4 %   Platelets 211 150 - 400 K/uL   nRBC 0.0 0.0 - 0.2 %  Heparin  level (unfractionated)     Status: None   Collection Time: 08/30/24  4:58 AM  Result Value Ref Range   Heparin  Unfractionated 0.38 0.30 - 0.70 IU/mL  Iron  and TIBC     Status: Abnormal   Collection Time: 08/30/24  4:58 AM  Result Value Ref Range   Iron  <10 (L) 45 - 182 ug/dL   TIBC 790 (L) 749 - 549 ug/dL   Saturation Ratios NOT CALCULATED 17.9 - 39.5 %   UIBC NOT CALCULATED ug/dL  Ferritin     Status: Abnormal   Collection Time: 08/30/24  4:58 AM  Result Value Ref Range   Ferritin 340 (H) 24 - 336 ng/mL   ECHOCARDIOGRAM COMPLETE Result Date: 08/30/2024    ECHOCARDIOGRAM REPORT   Patient Name:   Adrian Michael Date of Exam: 08/30/2024 Medical Rec #:  [id number]   Height:       67.0 in Accession #:    [id number]  Weight:       134.9 lb Date of Birth:  Dec 19, 1935   BSA:          1.711 m Patient Age:    89 years    BP:           134/58 mmHg Patient Gender: M           HR:           73 bpm. Exam Location:  [facility] Procedure: 2D Echo, Cardiac Doppler, Color Doppler and Strain  Analysis (Both            Spectral and Color Flow Doppler were utilized during procedure). Indications:     Elevated Troponin  History:         Patient has no prior history of Echocardiogram examinations.  Sonographer:     Thedora Louder RDCS, FASE Referring Phys:  [id number] MADISON A MANSY Diagnosing Phys: Denyse Bathe  Sonographer Comments: Global longitudinal strain was attempted. Challenging study due  to patient's low acoustic windows. IMPRESSIONS  1. Left ventricular ejection fraction, by estimation, is 60 to 65%. The left ventricle has normal function. The left ventricle has no regional wall motion abnormalities. Left ventricular diastolic parameters were normal. The global longitudinal strain is normal.  2. Right ventricular systolic function is normal. The right ventricular size is normal.  3. The mitral valve is normal in structure. Mild mitral valve regurgitation. No evidence of mitral stenosis.  4. The aortic valve is normal in structure. Aortic valve regurgitation is mild to moderate. Aortic valve sclerosis/calcification is present, without any evidence of aortic stenosis.  5. The inferior vena cava is normal in size with greater than 50% respiratory variability, suggesting right atrial pressure of 3 mmHg. FINDINGS  Left Ventricle: Left ventricular ejection fraction, by estimation, is 60 to 65%. The left ventricle has normal function. The left ventricle has no regional wall motion abnormalities. Strain was performed and the global longitudinal strain is normal. The  left ventricular internal cavity size was normal in size. There is no left ventricular hypertrophy. Left ventricular diastolic parameters were normal. Right Ventricle: The right ventricular size is normal. No increase in right ventricular wall thickness. Right ventricular systolic function is normal. Left Atrium: Left atrial size was normal in size. Right Atrium: Right atrial size was normal in size. Pericardium: There is no evidence of  pericardial effusion. Mitral Valve: The mitral valve is normal in structure. Mild mitral valve regurgitation. No evidence of mitral valve stenosis. Tricuspid Valve: The tricuspid valve is normal in structure. Tricuspid valve regurgitation is trivial. No evidence of tricuspid stenosis. Aortic Valve: The aortic valve is normal in structure. Aortic valve regurgitation is mild to moderate. Aortic regurgitation PHT measures 810 msec. Aortic valve sclerosis/calcification is present, without any evidence of aortic stenosis. Aortic valve peak  gradient measures 5.7 mmHg. Pulmonic Valve: The pulmonic valve was normal in structure. Pulmonic valve regurgitation is not visualized. No evidence of pulmonic stenosis. Aorta: The aortic root is normal in size and structure. Venous: The inferior vena cava is normal in size with greater than 50% respiratory variability, suggesting right atrial pressure of 3 mmHg. IAS/Shunts: No atrial level shunt detected by color flow Doppler. Additional Comments: 3D was performed not requiring image post processing on an independent workstation and was indeterminate.  LEFT VENTRICLE PLAX 2D LVIDd:         4.40 cm   Diastology LVIDs:         3.10 cm   LV e' medial:    6.85 cm/s LV PW:         1.30 cm   LV E/e' medial:  8.7 LV IVS:        1.20 cm   LV e' lateral:   6.74 cm/s LVOT diam:     2.40 cm   LV E/e' lateral: 8.9 LV SV:         95 LV SV Index:   55 LVOT Area:     4.52 cm  RIGHT VENTRICLE RV Basal diam:  3.70 cm RV S prime:     14.80 cm/s TAPSE (M-mode): 2.0 cm LEFT ATRIUM             Index        RIGHT ATRIUM           Index LA diam:        3.40 cm 1.99 cm/m   RA Area:     16.80 cm LA Vol (A2C):   36.7 ml  21.45 ml/m  RA Volume:   45.00 ml  26.30 ml/m LA Vol (A4C):   27.6 ml 16.13 ml/m LA Biplane Vol: 32.1 ml 18.76 ml/m  AORTIC VALVE                 PULMONIC VALVE AV Area (Vmax): 3.34 cm     PV Vmax:          1.15 m/s AV Vmax:        119.00 cm/s  PV Peak grad:     5.3 mmHg AV Peak Grad:    5.7 mmHg     PR End Diast Vel: 5.57 msec LVOT Vmax:      87.80 cm/s   RVOT Peak grad:   3 mmHg LVOT Vmean:     61.200 cm/s LVOT VTI:       0.209 m AI PHT:         810 msec  AORTA Ao Root diam: 3.70 cm MITRAL VALVE               TRICUSPID VALVE MV Area (PHT): 3.08 cm    TR Peak grad:   30.2 mmHg MV Decel Time: 246 msec    TR Vmax:        275.00 cm/s MV E velocity: 59.70 cm/s MV A velocity: 59.70 cm/s  SHUNTS MV E/A ratio:  1.00        Systemic VTI:  0.21 m                            Systemic Diam: 2.40 cm [employer] Electronically signed by Denyse Bathe Signature Date/Time: 08/30/2024/10:59:50 AM    Final    CT Head Wo Contrast Result Date: 08/29/2024 CLINICAL DATA:  Mental status change, unknown cause EXAM: CT HEAD WITHOUT CONTRAST TECHNIQUE: Contiguous axial images were obtained from the base of the skull through the vertex without intravenous contrast. RADIATION DOSE REDUCTION: This exam was performed according to the departmental dose-optimization program which includes automated exposure control, adjustment of the mA and/or kV according to patient size and/or use of iterative reconstruction technique. COMPARISON:  None Available. FINDINGS: Brain: There is periventricular white matter decreased attenuation consistent with small vessel ischemic changes. Ventricles, sulci and cisterns are prominent consistent with age related involutional changes. No acute intracranial hemorrhage, mass effect or shift. No hydrocephalus. Vascular: No hyperdense vessel or unexpected calcification. Skull: Normal. Negative for fracture or focal lesion. Sinuses/Orbits: No acute finding. IMPRESSION: Atrophy and chronic small vessel ischemic changes. No acute intracranial process identified. Electronically Signed   By: Fonda Field M.D.   On: 08/29/2024 18:39   DG Chest Port 1 View Result Date: 08/29/2024 CLINICAL DATA:  Shortness of breath EXAM: PORTABLE CHEST 1 VIEW COMPARISON:  None Available. FINDINGS: There are mild  patchy airspace opacities in both lower lobes. There is no pleural effusion or pneumothorax. Cardiomediastinal silhouette demonstrates mild cardiomegaly. There is no pneumothorax or acute fracture. IMPRESSION: Mild patchy airspace opacities in both lower lobes, concerning for pneumonia. Follow-up PA and lateral chest x-ray recommended in 4-6 weeks to confirm resolution. Electronically Signed   By: Greig Pique M.D.   On: 08/29/2024 18:28     ASSESSMENT AND PLAN: Elevated troponin in between 3-400 range.  Patient has no chest pain but does have shortness of breath which can be explained with influenza pneumonia.  There is no acute EKG changes thus is most likely due to demand ischemia.  Will look at  echocardiogram for ejection fraction as well as wall motion and make further recommendation.   #2 influenza pneumonia patient is on Tamiflu  and Rocephin  IV and more than likely shortness of breath is related to that with chest x-ray showing no signs of CHF. Thank you very much for follow-up.  Winda Summerall Fernand

## 2024-08-30 NOTE — Evaluation (Signed)
 Physical Therapy Evaluation Patient Details Name: Adrian Michael MRN: [id number] DOB: 12-Aug-1935 Today's Date: 08/30/2024  History of Present Illness  Pt is an 89 y.o. male presenting to hospital 08/29/24 with c/o respiratory distress (SOB and weakness).   Pt was living in [place] (recently admitted to ICU and discharged to rehab faclity) and came to [facility] about 6 days prior to admission.  Pt admitted with influenza PNA, AKI superimposed on CKD, elevated troponin level (likely d/t demand ischemia).  PMH includes advanced dementia, CKD, a-fib, htn, DM, gout.  Clinical Impression  Prior to recent medical concerns, pt's daughter reports pt was able to walk 3-4 steps with rollator November 2024 but otherwise used w/c; had decline in status since then and using w/c with assist since April 2025; recent rehab stay pt was walking short distance with assist; most recently (since coming to [place] about 1 week ago) pt requiring assist with w/c level transfers; lives with his daughter and son in law on main level of home with ramp to enter; has 12 hour home care for pt set up 7 days a week (currently on hold d/t hospitalization).  Currently pt is mod assist semi-supine to sitting EOB; CGA to min assist for sitting balance (d/t posterior lean); and max assist stand pivot transfer bed to recliner.  Pt's daughter reports limited ability to assist pt physically and is returning to work later this week after medical leave.  Pt would currently benefit from skilled PT to address noted impairments and functional limitations (see below for any additional details).  Upon hospital discharge, pt would benefit from ongoing therapy.     If plan is discharge home, recommend the following: Two people to help with walking and/or transfers;A lot of help with bathing/dressing/bathroom;Assistance with cooking/housework;Assist for transportation;Help with stairs or ramp for entrance   Can travel by private vehicle   No    Equipment  Recommendations Other (comment) (TBD at next facility)  Recommendations for Other Services       Functional Status Assessment Patient has had a recent decline in their functional status and demonstrates the ability to make significant improvements in function in a reasonable and predictable amount of time.     Precautions / Restrictions Precautions Precautions: Fall Recall of Precautions/Restrictions: Impaired Restrictions Weight Bearing Restrictions Per Provider Order: No      Mobility  Bed Mobility Overal bed mobility: Needs Assistance Bed Mobility: Supine to Sit     Supine to sit: Mod assist, HOB elevated, Used rails     General bed mobility comments: assist for trunk and scooting to EOB; vc's for technique; use of bed rail    Transfers Overall transfer level: Needs assistance Equipment used: None Transfers: Bed to chair/wheelchair/BSC   Stand pivot transfers: Max assist         General transfer comment: stand pivot bed to recliner (to R side) with max assist x1; vc's for technique; pt initially reaching for therapists neck to hold onto (like he does at home)--therapist gave pt cues to hold onto her hips instead for support for pt/therapist safety    Ambulation/Gait               General Gait Details: Deferred d/t safety concerns  Stairs            Wheelchair Mobility     Tilt Bed    Modified Rankin (Stroke Patients Only)       Balance Overall balance assessment: Needs assistance Sitting-balance support: No upper extremity supported, Feet  supported Sitting balance-Leahy Scale: Poor Sitting balance - Comments: CGA to occasional min assist for sitting balance (static) Postural control: Posterior lean                                   Pertinent Vitals/Pain Pain Assessment Pain Assessment: Faces Faces Pain Scale: No hurt Pain Intervention(s): Limited activity within patient's tolerance, Monitored during session,  Repositioned SpO2 sats 94% or greater on 2 L O2 via nasal cannula during sessions activities; HR 70's to 80's bpm.    Home Living Family/patient expects to be discharged to:: Private residence Living Arrangements: Children (Pt's daughter and her husband) Available Help at Discharge: Family;Personal care attendant Type of Home: House Home Access: Ramped entrance       Home Layout: Two level;Able to live on main level with bedroom/bathroom Home Equipment: Rollator (4 wheels);Wheelchair - manual;BSC/3in1;Other (comment);Grab bars - tub/shower;Shower seat;Shower seat - built in (gait belt)      Prior Function Prior Level of Function : Needs assist  No home O2 use.           Mobility Comments: Pt's daughter reports pt had been walking 3-4 steps with rollator (assist to stand up to walker though) but otherwise using w/c when she visited pt in November 2024; daughter reports decline in status since then and was requiring assist with transfers (w/c level) since April; per report daughter received pt was walking about 20 feet with assist during recent rehab stay; since coming to [facility] pt has required assist with transfers w/c level (pt's daughter unable to assist pt d/t shoulder surgery--going back to work soon) ADLs Comments: Has 12 hour caregiver support 7 days a week (currently on hold d/t hospitalization).  Pt was able to pull pants up with support in standing.     Extremity/Trunk Assessment   Upper Extremity Assessment Upper Extremity Assessment: Defer to OT evaluation    Lower Extremity Assessment Lower Extremity Assessment: Generalized weakness;Difficult to assess due to impaired cognition (pt had difficulty following cues to assess)    Cervical / Trunk Assessment Cervical / Trunk Assessment: Other exceptions Cervical / Trunk Exceptions: forward head/shoulders  Communication   Communication Factors Affecting Communication: Hearing impaired    Cognition Arousal: Alert Behavior  During Therapy: Flat affect   PT - Cognitive impairments: History of cognitive impairments                       PT - Cognition Comments: Pt oriented to name and DOB; pt switching between [place] and another language during eval (pt's daughter reports pt has no issues speaking/understanding English and declined interpreter) Following commands: Impaired Following commands impaired: Follows one step commands inconsistently     Cueing Cueing Techniques: Verbal cues, Tactile cues, Visual cues     General Comments General comments (skin integrity, edema, etc.): Pt's bed and gown noted to be wet; new gown placed and nurse notified of need to check to make sure purewick working; bed linens changed by pt's daughter (who is a Engineer, civil (consulting))    Exercises     Assessment/Plan    PT Assessment Patient needs continued PT services  PT Problem List Decreased strength;Decreased activity tolerance;Decreased balance;Decreased mobility;Cardiopulmonary status limiting activity       PT Treatment Interventions DME instruction;Gait training;Stair training;Functional mobility training;Therapeutic activities;Therapeutic exercise;Balance training;Patient/family education    PT Goals (Current goals can be found in the Care Plan section)  Acute  Rehab PT Goals Patient Stated Goal: to improve functional status PT Goal Formulation: With family Time For Goal Achievement: 09/13/24 Potential to Achieve Goals: Fair    Frequency Min 2X/week     Co-evaluation               AM-PAC PT 6 Clicks Mobility  Outcome Measure Help needed turning from your back to your side while in a flat bed without using bedrails?: A Little Help needed moving from lying on your back to sitting on the side of a flat bed without using bedrails?: A Lot Help needed moving to and from a bed to a chair (including a wheelchair)?: A Lot Help needed standing up from a chair using your arms (e.g., wheelchair or bedside chair)?: A  Lot Help needed to walk in hospital room?: Total Help needed climbing 3-5 steps with a railing? : Total 6 Click Score: 11    End of Session Equipment Utilized During Treatment: Gait belt Activity Tolerance: Patient tolerated treatment well Patient left: in chair;with call bell/phone within reach;with chair alarm set;with nursing/sitter in room;with family/visitor present;Other (comment) (B heels floating via pillow support) Nurse Communication: Mobility status;Precautions PT Visit Diagnosis: Other abnormalities of gait and mobility (R26.89);Muscle weakness (generalized) (M62.81)    Time: 8679-8642 PT Time Calculation (min) (ACUTE ONLY): 37 min   Charges:   PT Evaluation $PT Eval Low Complexity: 1 Low PT Treatments $Therapeutic Activity: 8-22 mins PT General Charges $$ ACUTE PT VISIT: 1 Visit        Damien Caulk, PT 08/30/24, 2:57 PM

## 2024-08-30 NOTE — Progress Notes (Signed)
  Echocardiogram 2D Echocardiogram has been performed.  Adrian Michael 08/30/2024, 9:23 AM

## 2024-08-30 NOTE — Plan of Care (Signed)
  Problem: Clinical Measurements: Goal: Ability to maintain clinical measurements within normal limits will improve Outcome: Progressing Goal: Respiratory complications will improve Outcome: Progressing Goal: Cardiovascular complication will be avoided Outcome: Progressing   Problem: Activity: Goal: Risk for activity intolerance will decrease Outcome: Progressing   Problem: Coping: Goal: Level of anxiety will decrease Outcome: Progressing   Problem: Elimination: Goal: Will not experience complications related to urinary retention Outcome: Progressing   Problem: Pain Managment: Goal: General experience of comfort will improve and/or be controlled Outcome: Progressing   Problem: Safety: Goal: Ability to remain free from injury will improve Outcome: Progressing   Problem: Activity: Goal: Ability to tolerate increased activity will improve Outcome: Progressing   Problem: Respiratory: Goal: Ability to maintain adequate ventilation will improve Outcome: Progressing

## 2024-08-30 NOTE — Progress Notes (Addendum)
 Progress Note    Adrian Michael  FMW:[id number] DOB: 1935-04-29  DOA: 08/29/2024 PCP: Godfrey Area, MD      Brief Narrative:    Medical records reviewed and are as summarized below:  Adrian Michael is a 89 y.o. male with medical history significant for hypertension, mixed Alzheimer's and vascular dementia, GERD, hypothyroidism, type II DM, CKD stage IIIb, gout, dyslipidemia, hyperparathyroidism, BPH, who presented to the hospital with generalized weakness, fever, poor appetite, productive cough and worsening shortness of breath.  Oxygen saturation was down into the 80s with EMS so he was placed on oxygen via nonrebreather mask and transported to the ED.  There is also report that he had tested positive for influenza on Wednesday, 08/25/2024.     Reportedly, patient had been living in [place] but was malnourished and had been recently admitted to the ICU.  He was discharged to a rehab facility where he spent several weeks.  His daughter decided to bring him to [place]  (about 6 days prior to admission) so that she could take care of him.  Chest x-ray showed mild patchy airspace opacities in both lower lobes, concerning for pneumonia.      Assessment/Plan:   Principal Problem:   Influenzal pneumonia Active Problems:   Acute kidney injury superimposed on chronic kidney disease (HCC)   Elevated troponin I level   Essential hypertension   Gout   BPH (benign prostatic hyperplasia)   Mixed Alzheimer's and vascular dementia (HCC)   Elevated brain natriuretic peptide (BNP) level    Body mass index is 21.13 kg/m.   Influenza A pneumonia, possibly secondary bacterial pneumonia as well: Continue Tamiflu , IV ceftriaxone  and azithromycin . Follow-up blood cultures.   Acute hypoxic respiratory failure: He is on 3 L/min oxygen via [place]. Initially on 15 L/min oxygen via [place].   AKI on CKD stage IIIb with metabolic acidosis: Continue IV sodium bicarbonate  infusion.  Monitor  BMP.  Follow-up with nephrologist. BUN down from 143-118, creatinine down from 2.58-2.39, bicarb up from 13-17.   Hyperkalemia: Improved    Elevated troponins: 322, 328, 331, 338.  This is likely due to demand ischemia.  Discontinue IV heparin  drip. Elevated BNP (413): Likely from AKI on CKD 2D echo showed EF estimated at 60 to 65%, normal LV diastolic parameters, mild MR.   Type II DM with hyperglycemia: Monitor glucose level   Acute on chronic anemia, suspect underlying anemia of chronic kidney disease: Drop in H&H may be due to hemodilution.  Monitor H&H.  No indication for blood transfusion at this time. Check iron  studies, B12 level   Alzheimer's and vascular dementia: Continue Aricept . Supportive care    General Weakness, ambulatory dysfunction: PT and OT evaluation    Comorbidities include BPH, gout, hypertension, hypothyroidism     Diet Order             Diet NPO time specified  Diet effective midnight                                  Consultants: Nephrologist Cardiologist  Procedures: None    Medications:    allopurinol   100 mg Oral Daily   amLODipine   5 mg Oral Daily   donepezil   5 mg Oral QHS   guaiFENesin   600 mg Oral BID   oseltamivir   75 mg Oral BID   tamsulosin   0.4 mg Oral QPC supper   Continuous Infusions:  azithromycin   cefTRIAXone  (ROCEPHIN )  IV     heparin  750 Units/hr (08/30/24 0509)   sodium bicarbonate  150 mEq in sterile water  1,150 mL infusion Stopped (08/30/24 0454)     Anti-infectives (From admission, onward)    Start     Dose/Rate Route Frequency Ordered Stop   08/30/24 1800  cefTRIAXone  (ROCEPHIN ) 2 g in sodium chloride  0.9 % 100 mL IVPB        2 g 200 mL/hr over 30 Minutes Intravenous Every 24 hours 08/29/24 2115 09/04/24 1759   08/30/24 1800  azithromycin  (ZITHROMAX ) 500 mg in sodium chloride  0.9 % 250 mL IVPB        500 mg 250 mL/hr over 60 Minutes Intravenous Every 24 hours 08/29/24 2115  09/04/24 1759   08/29/24 2130  oseltamivir  (TAMIFLU ) capsule 75 mg        75 mg Oral 2 times daily 08/29/24 2115 09/03/24 2159   08/29/24 1845  cefTRIAXone  (ROCEPHIN ) 2 g in sodium chloride  0.9 % 100 mL IVPB        2 g 200 mL/hr over 30 Minutes Intravenous Once 08/29/24 1831 08/29/24 1928   08/29/24 1845  azithromycin  (ZITHROMAX ) 500 mg in sodium chloride  0.9 % 250 mL IVPB        500 mg 250 mL/hr over 60 Minutes Intravenous  Once 08/29/24 1831 08/29/24 2100              Family Communication/Anticipated D/C date and plan/Code Status   DVT prophylaxis:      Code Status: Full Code  Family Communication: None Disposition Plan: Plan to discharge home   Status is: Inpatient Remains inpatient appropriate because: Pneumonia, AKI and respiratory failure       Subjective:   Interval events noted.  He is confused and unable to provide any history.  Echo technician was at the bedside  Objective:    Vitals:   08/29/24 2139 08/29/24 2327 08/30/24 0428 08/30/24 0729  BP: (!) 154/65 (!) 146/56 (!) 134/58 (!) 122/56  Pulse: 70 81 67 65  Resp: 18 20 17 20   Temp: 98 F (36.7 C) 99.9 F (37.7 C) 97.6 F (36.4 C) 98.4 F (36.9 C)  TempSrc: Oral Oral Oral   SpO2: 96% 100% 96% 96%  Weight:      Height:       No data found.   Intake/Output Summary (Last 24 hours) at 08/30/2024 0901 Last data filed at 08/30/2024 0509 Gross per 24 hour  Intake 1750.26 ml  Output 600 ml  Net 1150.26 ml   Filed Weights   08/29/24 1805 08/29/24 2112  Weight: 61.2 kg 61.2 kg    Exam:  GEN: NAD, coughing repeatedly during this encounter SKIN: Warm and dry EYES: No pallor or icterus ENT: MMM CV: RRR PULM: Bibasilar rales.  No wheezing ABD: soft, ND, NT, +BS CNS: AAO x 1 (person), non focal EXT: No edema or tenderness        Data Reviewed:   I have personally reviewed following labs and imaging studies:  Labs: Labs show the following:   Basic Metabolic Panel: Recent  Labs  Lab 08/29/24 1801 08/30/24 0458  NA 141 141  K 5.7* 5.1  CL 117* 114*  CO2 13* 17*  GLUCOSE 252* 133*  BUN 143* 118*  CREATININE 2.58* 2.39*  CALCIUM  8.5* 8.5*   GFR Estimated Creatinine Clearance: 18.8 mL/min (A) (by C-G formula based on SCr of 2.39 mg/dL (H)). Liver Function Tests: Recent Labs  Lab 08/29/24 1801 08/30/24 0458  AST 72* 69*  ALT 69* 57*  ALKPHOS 107 80  BILITOT 0.4 0.5  PROT 6.4* 5.1*  ALBUMIN 3.2* 2.5*   No results for input(s): LIPASE, AMYLASE in the last 168 hours. No results for input(s): AMMONIA in the last 168 hours. Coagulation profile Recent Labs  Lab 08/29/24 2157  INR 1.1    CBC: Recent Labs  Lab 08/29/24 1801 08/30/24 0458  WBC 5.6 7.5  NEUTROABS 4.0  --   HGB 9.8* 8.8*  HCT 31.1* 27.2*  MCV 107.2* 103.0*  PLT 220 211   Cardiac Enzymes: No results for input(s): CKTOTAL, CKMB, CKMBINDEX, TROPONINI in the last 168 hours. BNP (last 3 results) No results for input(s): PROBNP in the last 8760 hours. CBG: No results for input(s): GLUCAP in the last 168 hours. D-Dimer: No results for input(s): DDIMER in the last 72 hours. Hgb A1c: No results for input(s): HGBA1C in the last 72 hours. Lipid Profile: No results for input(s): CHOL, HDL, LDLCALC, TRIG, CHOLHDL, LDLDIRECT in the last 72 hours. Thyroid function studies: No results for input(s): TSH, T4TOTAL, T3FREE, THYROIDAB in the last 72 hours.  Invalid input(s): FREET3 Anemia work up: No results for input(s): VITAMINB12, FOLATE, FERRITIN, TIBC, IRON , RETICCTPCT in the last 72 hours. Sepsis Labs: Recent Labs  Lab 08/29/24 1801 08/29/24 2102 08/30/24 0458  WBC 5.6  --  7.5  LATICACIDVEN 2.0* 1.9  --     Microbiology Recent Results (from the past 240 hours)  Culture, blood (routine x 2)     Status: None (Preliminary result)   Collection Time: 08/29/24  6:00 PM   Specimen: BLOOD  Result Value Ref Range Status    Specimen Description BLOOD BLOOD LEFT FOREARM  Final   Special Requests   Final    BOTTLES DRAWN AEROBIC AND ANAEROBIC Blood Culture results may not be optimal due to an inadequate volume of blood received in culture bottles   Culture   Final    NO GROWTH < 12 HOURS Performed at [facility], [address], [place], [place] [address]    Report Status PENDING  Incomplete  Culture, blood (routine x 2)     Status: None (Preliminary result)   Collection Time: 08/29/24  6:00 PM   Specimen: BLOOD  Result Value Ref Range Status   Specimen Description BLOOD BLOOD RIGHT FOREARM  Final   Special Requests   Final    BOTTLES DRAWN AEROBIC AND ANAEROBIC Blood Culture results may not be optimal due to an inadequate volume of blood received in culture bottles   Culture   Final    NO GROWTH < 12 HOURS Performed at [facility], [address], [place], [place] [address]    Report Status PENDING  Incomplete  Resp panel by RT-PCR (RSV, Flu A&B, Covid) Anterior Nasal Swab     Status: Abnormal   Collection Time: 08/29/24  6:01 PM   Specimen: Anterior Nasal Swab  Result Value Ref Range Status   SARS Coronavirus 2 by RT PCR NEGATIVE NEGATIVE Final    Comment: (NOTE) SARS-CoV-2 target nucleic acids are NOT DETECTED.  The SARS-CoV-2 RNA is generally detectable in upper respiratory specimens during the acute phase of infection. The lowest concentration of SARS-CoV-2 viral copies this assay can detect is 138 copies/mL. A negative result does not preclude SARS-Cov-2 infection and should not be used as the sole basis for treatment or other patient management decisions. A negative result may occur with  improper specimen collection/handling, submission of specimen  other than nasopharyngeal swab, presence of viral mutation(s) within the areas targeted by this assay, and inadequate number of viral copies(<138 copies/mL). A negative result must be combined with clinical observations,  patient history, and epidemiological information. The expected result is Negative.  Fact Sheet for Patients:  BloggerCourse.com  Fact Sheet for Healthcare Providers:  SeriousBroker.it  This test is no t yet approved or cleared by the [place]  FDA and  has been authorized for detection and/or diagnosis of SARS-CoV-2 by FDA under an Emergency Use Authorization (EUA). This EUA will remain  in effect (meaning this test can be used) for the duration of the COVID-19 declaration under Section 564(b)(1) of the Act, 21 U.S.C.section 360bbb-3(b)(1), unless the authorization is terminated  or revoked sooner.       Influenza A by PCR POSITIVE (A) NEGATIVE Final   Influenza B by PCR NEGATIVE NEGATIVE Final    Comment: (NOTE) The Xpert Xpress SARS-CoV-2/FLU/RSV plus assay is intended as an aid in the diagnosis of influenza from Nasopharyngeal swab specimens and should not be used as a sole basis for treatment. Nasal washings and aspirates are unacceptable for Xpert Xpress SARS-CoV-2/FLU/RSV testing.  Fact Sheet for Patients: BloggerCourse.com  Fact Sheet for Healthcare Providers: SeriousBroker.it  This test is not yet approved or cleared by the [place]  FDA and has been authorized for detection and/or diagnosis of SARS-CoV-2 by FDA under an Emergency Use Authorization (EUA). This EUA will remain in effect (meaning this test can be used) for the duration of the COVID-19 declaration under Section 564(b)(1) of the Act, 21 U.S.C. section 360bbb-3(b)(1), unless the authorization is terminated or revoked.     Resp Syncytial Virus by PCR NEGATIVE NEGATIVE Final    Comment: (NOTE) Fact Sheet for Patients: BloggerCourse.com  Fact Sheet for Healthcare Providers: SeriousBroker.it  This test is not yet approved or cleared by the [place] FDA and has been authorized for detection and/or diagnosis of SARS-CoV-2 by FDA under an Emergency Use Authorization (EUA). This EUA will remain in effect (meaning this test can be used) for the duration of the COVID-19 declaration under Section 564(b)(1) of the Act, 21 U.S.C. section 360bbb-3(b)(1), unless the authorization is terminated or revoked.  Performed at [facility], [address] [place], [place] [address]     Procedures and diagnostic studies:  CT Head Wo Contrast Result Date: 08/29/2024 CLINICAL DATA:  Mental status change, unknown cause EXAM: CT HEAD WITHOUT CONTRAST TECHNIQUE: Contiguous axial images were obtained from the base of the skull through the vertex without intravenous contrast. RADIATION DOSE REDUCTION: This exam was performed according to the departmental dose-optimization program which includes automated exposure control, adjustment of the mA and/or kV according to patient size and/or use of iterative reconstruction technique. COMPARISON:  None Available. FINDINGS: Brain: There is periventricular white matter decreased attenuation consistent with small vessel ischemic changes. Ventricles, sulci and cisterns are prominent consistent with age related involutional changes. No acute intracranial hemorrhage, mass effect or shift. No hydrocephalus. Vascular: No hyperdense vessel or unexpected calcification. Skull: Normal. Negative for fracture or focal lesion. Sinuses/Orbits: No acute finding. IMPRESSION: Atrophy and chronic small vessel ischemic changes. No acute intracranial process identified. Electronically Signed   By: Fonda Field M.D.   On: 08/29/2024 18:39   DG Chest Port 1 View Result Date: 08/29/2024 CLINICAL DATA:  Shortness of breath EXAM: PORTABLE CHEST 1 VIEW COMPARISON:  None Available. FINDINGS: There are mild patchy airspace opacities in both lower lobes. There is no pleural effusion  or pneumothorax. Cardiomediastinal silhouette  demonstrates mild cardiomegaly. There is no pneumothorax or acute fracture. IMPRESSION: Mild patchy airspace opacities in both lower lobes, concerning for pneumonia. Follow-up PA and lateral chest x-ray recommended in 4-6 weeks to confirm resolution. Electronically Signed   By: Greig Pique M.D.   On: 08/29/2024 18:28               LOS: 1 day   Janeece Blok  [facility]   Pager on [email or web address]. If 7PM-7AM, please contact night-coverage at [email or web address]     08/30/2024, 9:01 AM

## 2024-08-30 NOTE — Consult Note (Signed)
 Pharmacy Consult Note - Anticoagulation  Pharmacy Consult for Heparin  Indication: chest pain/ACS No Known Allergies  PATIENT MEASUREMENTS: Height: 5' 7 (170.2 cm) Weight: 61.2 kg (134 lb 14.7 oz) IBW/kg (Calculated) : 66.1 HEPARIN  DW (KG): 61.2  VITAL SIGNS: Temp: 97.6 F (36.4 C) (09/01 0428) Temp Source: Oral (09/01 0428) BP: 134/58 (09/01 0428) Pulse Rate: 67 (09/01 0428)  Recent Labs    08/29/24 1801 08/29/24 2102 08/29/24 2157 08/29/24 2305 08/30/24 0458  HGB 9.8*  --   --   --  8.8*  HCT 31.1*  --   --   --  27.2*  PLT 220  --   --   --  211  APTT  --   --  37*  --   --   LABPROT  --   --  14.8  --   --   INR  --   --  1.1  --   --   HEPARINUNFRC  --    < > <0.10*  --  0.38  CREATININE 2.58*  --   --   --   --   TROPONINIHS 322*   < > 331* 338*  --    < > = values in this interval not displayed.    Estimated Creatinine Clearance: 17.5 mL/min (A) (by C-G formula based on SCr of 2.58 mg/dL (H)).  PAST MEDICAL HISTORY: History reviewed. No pertinent past medical history.  Medications:  Medications Prior to Admission  Medication Sig Dispense Refill Last Dose/Taking   allopurinol  (ZYLOPRIM ) 100 MG tablet Take 100 mg by mouth daily.      amLODipine  (NORVASC ) 5 MG tablet Take 5 mg by mouth daily.      CANNABIDIOL PO Take by mouth.      Cholecalciferol (VITAMIN D-3) 125 MCG (5000 UT) TABS Take by mouth.      donepezil  (ARICEPT ) 5 MG tablet Take 5 mg by mouth at bedtime.      Ginkgo Biloba 40 MG TABS Take by mouth.      Misc Natural Products (OSTEO BI-FLEX TRIPLE STRENGTH PO) Take by mouth.      Multiple Vitamins-Minerals (CENTRUM SILVER 50+MEN) TABS Take by mouth.      tamsulosin  (FLOMAX ) 0.4 MG CAPS capsule Take 0.4 mg by mouth.      triamcinolone cream (KENALOG) 0.1 % Apply 1 application topically 2 (two) times daily.      Scheduled:   allopurinol   100 mg Oral Daily   amLODipine   5 mg Oral Daily   donepezil   5 mg Oral QHS   guaiFENesin   600 mg Oral BID    ipratropium-albuterol   3 mL Nebulization QID   oseltamivir   75 mg Oral BID   tamsulosin   0.4 mg Oral QPC supper   Infusions:   azithromycin      cefTRIAXone  (ROCEPHIN )  IV     heparin  750 Units/hr (08/30/24 0509)   sodium bicarbonate  150 mEq in sterile water  1,150 mL infusion Stopped (08/30/24 0454)   PRN: acetaminophen  **OR** acetaminophen , chlorpheniramine-HYDROcodone, magnesium  hydroxide, ondansetron  **OR** ondansetron  (ZOFRAN ) IV, traZODone   ASSESSMENT: 89 y.o. male with PMH advanced dementia, chronic kidney disease, and atrial fibrillation is presenting with shortness of breath and weakness. Patient is not on chronic anticoagulation per chart review. Pharmacy has been consulted to initiate and manage heparin  intravenous infusion.  Goal(s) of therapy: Heparin  level 0.3 - 0.7 units/mL aPTT 66 - 102 seconds Monitor platelets by anticoagulation protocol: Yes   Baseline anticoagulation labs: Recent Labs    08/29/24  1801 08/29/24 2157 08/30/24 0458  APTT  --  37*  --   INR  --  1.1  --   HGB 9.8*  --  8.8*  PLT 220  --  211   Baseline labs: CBC stable, aPTT 37 sec, INR 1.1   9/1@0458 : HL 0.38, therapeutic x 1  PLAN: - Continue heparin  infusion at 750 units/hour. - Check confirmatory heparin  level in 8 hours, then daily once at least two levels are consecutively therapeutic. - Monitor CBC daily while on heparin  infusion.   Prue Lingenfelter A Abbrielle Batts, PharmD Clinical Pharmacist 08/30/2024 6:35 AM

## 2024-08-31 DIAGNOSIS — J1100 Influenza due to unidentified influenza virus with unspecified type of pneumonia: Secondary | ICD-10-CM | POA: Diagnosis not present

## 2024-08-31 LAB — COMPREHENSIVE METABOLIC PANEL WITH GFR
ALT: 111 U/L — ABNORMAL HIGH (ref 0–44)
AST: 185 U/L — ABNORMAL HIGH (ref 15–41)
Albumin: 2.2 g/dL — ABNORMAL LOW (ref 3.5–5.0)
Alkaline Phosphatase: 79 U/L (ref 38–126)
Anion gap: 10 (ref 5–15)
BUN: 122 mg/dL — ABNORMAL HIGH (ref 8–23)
CO2: 25 mmol/L (ref 22–32)
Calcium: 8.1 mg/dL — ABNORMAL LOW (ref 8.9–10.3)
Chloride: 108 mmol/L (ref 98–111)
Creatinine, Ser: 2.18 mg/dL — ABNORMAL HIGH (ref 0.61–1.24)
GFR, Estimated: 29 mL/min — ABNORMAL LOW (ref >60)
Glucose, Bld: 100 mg/dL — ABNORMAL HIGH (ref 70–99)
Potassium: 4.1 mmol/L (ref 3.5–5.1)
Sodium: 143 mmol/L (ref 135–145)
Total Bilirubin: 0.6 mg/dL (ref 0.0–1.2)
Total Protein: 4.6 g/dL — ABNORMAL LOW (ref 6.5–8.1)

## 2024-08-31 LAB — CBC
HCT: 21.6 % — ABNORMAL LOW (ref 39.0–52.0)
Hemoglobin: 7.2 g/dL — ABNORMAL LOW (ref 13.0–17.0)
MCH: 33.5 pg (ref 26.0–34.0)
MCHC: 33.3 g/dL (ref 30.0–36.0)
MCV: 100.5 fL — ABNORMAL HIGH (ref 80.0–100.0)
Platelets: 194 K/uL (ref 150–400)
RBC: 2.15 MIL/uL — ABNORMAL LOW (ref 4.22–5.81)
RDW: 14 % (ref 11.5–15.5)
WBC: 7.4 K/uL (ref 4.0–10.5)
nRBC: 0 % (ref 0.0–0.2)

## 2024-08-31 LAB — PREPARE RBC (CROSSMATCH)

## 2024-08-31 LAB — MAGNESIUM: Magnesium: 2.3 mg/dL (ref 1.7–2.4)

## 2024-08-31 LAB — ABO/RH: ABO/RH(D): O POS

## 2024-08-31 MED ORDER — ENSURE PLUS HIGH PROTEIN PO LIQD: 237.0000 mL | Freq: Two times a day (BID) | ORAL | Status: DC

## 2024-08-31 MED ORDER — LACTATED RINGERS IV SOLN: INTRAVENOUS | Status: AC

## 2024-08-31 MED ORDER — SODIUM CHLORIDE 0.9% IV SOLUTION: Freq: Once | INTRAVENOUS | Status: AC

## 2024-08-31 MED ORDER — HEPARIN SODIUM (PORCINE) 5000 UNIT/ML IJ SOLN
5000.0000 [IU] | Freq: Three times a day (TID) | INTRAMUSCULAR | Status: DC
  Administered 2024-08-31 – 2024-09-05 (×16): 5000 [IU] via SUBCUTANEOUS
  Filled 2024-08-31 (×16): qty 1

## 2024-08-31 NOTE — Plan of Care (Signed)
  Problem: Clinical Measurements: Goal: Respiratory complications will improve Outcome: Progressing Goal: Cardiovascular complication will be avoided Outcome: Progressing   Problem: Nutrition: Goal: Adequate nutrition will be maintained Outcome: Not Progressing   

## 2024-08-31 NOTE — Progress Notes (Signed)
 [facility]  ROUNDING NOTE   Subjective:   Patient seen resting quietly in bed Opens eyes to name, no verbal response Remains on IVF 3L [place]  Objective:  Vital signs in last 24 hours:  Temp:  [97.8 F (36.6 C)-98.2 F (36.8 C)] 97.8 F (36.6 C) (09/02 0931) Pulse Rate:  [70-91] 73 (09/02 0931) Resp:  [16-20] 16 (09/02 0931) BP: (106-138)/(47-61) 118/56 (09/02 0931) SpO2:  [94 %-99 %] 95 % (09/02 0931)  Weight change:  Filed Weights   08/29/24 1805 08/29/24 2112  Weight: 61.2 kg 61.2 kg    Intake/Output: I/O last 3 completed shifts: In: 4737.5 [P.O.:240; I.V.:3396.6; IV Piggyback:1100.9] Out: 1200 [Urine:1200]   Intake/Output this shift:  Total I/O In: 480 [P.O.:480] Out: -   Physical Exam: General: NAD, chronically ill appearing, cachetic   Head: Normocephalic, atraumatic. Moist oral mucosal membranes  Eyes: Anicteric  Lungs:  Diminished (hindered by positioning)   Heart: Regular rate and rhythm  Abdomen:  Soft, nontender  Extremities:  No peripheral edema.  Neurologic: Awake, alert, conversant  Skin: Warm,dry, no rash       Basic Metabolic Panel: Recent Labs  Lab 08/29/24 1801 08/30/24 0458 08/31/24 0532  NA 141 141 143  K 5.7* 5.1 4.1  CL 117* 114* 108  CO2 13* 17* 25  GLUCOSE 252* 133* 100*  BUN 143* 118* 122*  CREATININE 2.58* 2.39* 2.18*  CALCIUM  8.5* 8.5* 8.1*  MG  --   --  2.3    Liver Function Tests: Recent Labs  Lab 08/29/24 1801 08/30/24 0458 08/31/24 0532  AST 72* 69* 185*  ALT 69* 57* 111*  ALKPHOS 107 80 79  BILITOT 0.4 0.5 0.6  PROT 6.4* 5.1* 4.6*  ALBUMIN 3.2* 2.5* 2.2*   No results for input(s): LIPASE, AMYLASE in the last 168 hours. No results for input(s): AMMONIA in the last 168 hours.  CBC: Recent Labs  Lab 08/29/24 1801 08/30/24 0458 08/31/24 0532  WBC 5.6 7.5 7.4  NEUTROABS 4.0  --   --   HGB 9.8* 8.8* 7.2*  HCT 31.1* 27.2* 21.6*  MCV 107.2* 103.0* 100.5*  PLT 220 211 194     Cardiac Enzymes: No results for input(s): CKTOTAL, CKMB, CKMBINDEX, TROPONINI in the last 168 hours.  BNP: Invalid input(s): POCBNP  CBG: No results for input(s): GLUCAP in the last 168 hours.  Microbiology: Results for orders placed or performed during the hospital encounter of 08/29/24  Culture, blood (routine x 2)     Status: None (Preliminary result)   Collection Time: 08/29/24  6:00 PM   Specimen: BLOOD  Result Value Ref Range Status   Specimen Description BLOOD BLOOD LEFT FOREARM  Final   Special Requests   Final    BOTTLES DRAWN AEROBIC AND ANAEROBIC Blood Culture results may not be optimal due to an inadequate volume of blood received in culture bottles   Culture   Final    NO GROWTH 2 DAYS Performed at [facility], [address] [place], [place] [address]    Report Status PENDING  Incomplete  Culture, blood (routine x 2)     Status: None (Preliminary result)   Collection Time: 08/29/24  6:00 PM   Specimen: BLOOD  Result Value Ref Range Status   Specimen Description BLOOD BLOOD RIGHT FOREARM  Final   Special Requests   Final    BOTTLES DRAWN AEROBIC AND ANAEROBIC Blood Culture results may not be optimal due to an inadequate volume of blood received  in culture bottles   Culture   Final    NO GROWTH 2 DAYS Performed at [facility], [address] [place], [place] [address]    Report Status PENDING  Incomplete  Resp panel by RT-PCR (RSV, Flu A&B, Covid) Anterior Nasal Swab     Status: Abnormal   Collection Time: 08/29/24  6:01 PM   Specimen: Anterior Nasal Swab  Result Value Ref Range Status   SARS Coronavirus 2 by RT PCR NEGATIVE NEGATIVE Final    Comment: (NOTE) SARS-CoV-2 target nucleic acids are NOT DETECTED.  The SARS-CoV-2 RNA is generally detectable in upper respiratory specimens during the acute phase of infection. The lowest concentration of SARS-CoV-2 viral copies this assay can detect is 138 copies/mL. A negative  result does not preclude SARS-Cov-2 infection and should not be used as the sole basis for treatment or other patient management decisions. A negative result may occur with  improper specimen collection/handling, submission of specimen other than nasopharyngeal swab, presence of viral mutation(s) within the areas targeted by this assay, and inadequate number of viral copies(<138 copies/mL). A negative result must be combined with clinical observations, patient history, and epidemiological information. The expected result is Negative.  Fact Sheet for Patients:  BloggerCourse.com  Fact Sheet for Healthcare Providers:  SeriousBroker.it  This test is no t yet approved or cleared by the [place]  FDA and  has been authorized for detection and/or diagnosis of SARS-CoV-2 by FDA under an Emergency Use Authorization (EUA). This EUA will remain  in effect (meaning this test can be used) for the duration of the COVID-19 declaration under Section 564(b)(1) of the Act, 21 U.S.C.section 360bbb-3(b)(1), unless the authorization is terminated  or revoked sooner.       Influenza A by PCR POSITIVE (A) NEGATIVE Final   Influenza B by PCR NEGATIVE NEGATIVE Final    Comment: (NOTE) The Xpert Xpress SARS-CoV-2/FLU/RSV plus assay is intended as an aid in the diagnosis of influenza from Nasopharyngeal swab specimens and should not be used as a sole basis for treatment. Nasal washings and aspirates are unacceptable for Xpert Xpress SARS-CoV-2/FLU/RSV testing.  Fact Sheet for Patients: BloggerCourse.com  Fact Sheet for Healthcare Providers: SeriousBroker.it  This test is not yet approved or cleared by the [place]  FDA and has been authorized for detection and/or diagnosis of SARS-CoV-2 by FDA under an Emergency Use Authorization (EUA). This EUA will remain in effect (meaning this test can be  used) for the duration of the COVID-19 declaration under Section 564(b)(1) of the Act, 21 U.S.C. section 360bbb-3(b)(1), unless the authorization is terminated or revoked.     Resp Syncytial Virus by PCR NEGATIVE NEGATIVE Final    Comment: (NOTE) Fact Sheet for Patients: BloggerCourse.com  Fact Sheet for Healthcare Providers: SeriousBroker.it  This test is not yet approved or cleared by the [place]  FDA and has been authorized for detection and/or diagnosis of SARS-CoV-2 by FDA under an Emergency Use Authorization (EUA). This EUA will remain in effect (meaning this test can be used) for the duration of the COVID-19 declaration under Section 564(b)(1) of the Act, 21 U.S.C. section 360bbb-3(b)(1), unless the authorization is terminated or revoked.  Performed at [facility], [address] [place], [place] [address]     Coagulation Studies: Recent Labs    08/29/24 09-15-56  LABPROT 14.8  INR 1.1    Urinalysis: Recent Labs    08/29/24 09/15/01  COLORURINE YELLOW*  LABSPEC 1.014  PHURINE 5.0  GLUCOSEU NEGATIVE  HGBUR  NEGATIVE  BILIRUBINUR NEGATIVE  KETONESUR NEGATIVE  PROTEINUR 30*  NITRITE NEGATIVE  LEUKOCYTESUR NEGATIVE      Imaging: ECHOCARDIOGRAM COMPLETE Result Date: 08/30/2024    ECHOCARDIOGRAM REPORT   Patient Name:   FINN AMOS Date of Exam: 08/30/2024 Medical Rec #:  [id number]   Height:       67.0 in Accession #:    [id number]  Weight:       134.9 lb Date of Birth:  23-Aug-1935   BSA:          1.711 m Patient Age:    89 years    BP:           134/58 mmHg Patient Gender: M           HR:           73 bpm. Exam Location:  [facility] Procedure: 2D Echo, Cardiac Doppler, Color Doppler and Strain Analysis (Both            Spectral and Color Flow Doppler were utilized during procedure). Indications:     Elevated Troponin  History:         Patient has no prior history of Echocardiogram examinations.  Sonographer:      Thedora Louder RDCS, FASE Referring Phys:  [id number] MADISON A MANSY Diagnosing Phys: Denyse Bathe  Sonographer Comments: Global longitudinal strain was attempted. Challenging study due to patient's low acoustic windows. IMPRESSIONS  1. Left ventricular ejection fraction, by estimation, is 60 to 65%. The left ventricle has normal function. The left ventricle has no regional wall motion abnormalities. Left ventricular diastolic parameters were normal. The global longitudinal strain is normal.  2. Right ventricular systolic function is normal. The right ventricular size is normal.  3. The mitral valve is normal in structure. Mild mitral valve regurgitation. No evidence of mitral stenosis.  4. The aortic valve is normal in structure. Aortic valve regurgitation is mild to moderate. Aortic valve sclerosis/calcification is present, without any evidence of aortic stenosis.  5. The inferior vena cava is normal in size with greater than 50% respiratory variability, suggesting right atrial pressure of 3 mmHg. FINDINGS  Left Ventricle: Left ventricular ejection fraction, by estimation, is 60 to 65%. The left ventricle has normal function. The left ventricle has no regional wall motion abnormalities. Strain was performed and the global longitudinal strain is normal. The  left ventricular internal cavity size was normal in size. There is no left ventricular hypertrophy. Left ventricular diastolic parameters were normal. Right Ventricle: The right ventricular size is normal. No increase in right ventricular wall thickness. Right ventricular systolic function is normal. Left Atrium: Left atrial size was normal in size. Right Atrium: Right atrial size was normal in size. Pericardium: There is no evidence of pericardial effusion. Mitral Valve: The mitral valve is normal in structure. Mild mitral valve regurgitation. No evidence of mitral valve stenosis. Tricuspid Valve: The tricuspid valve is normal in structure. Tricuspid valve  regurgitation is trivial. No evidence of tricuspid stenosis. Aortic Valve: The aortic valve is normal in structure. Aortic valve regurgitation is mild to moderate. Aortic regurgitation PHT measures 810 msec. Aortic valve sclerosis/calcification is present, without any evidence of aortic stenosis. Aortic valve peak  gradient measures 5.7 mmHg. Pulmonic Valve: The pulmonic valve was normal in structure. Pulmonic valve regurgitation is not visualized. No evidence of pulmonic stenosis. Aorta: The aortic root is normal in size and structure. Venous: The inferior vena cava is normal in size with greater than 50% respiratory variability, suggesting right  atrial pressure of 3 mmHg. IAS/Shunts: No atrial level shunt detected by color flow Doppler. Additional Comments: 3D was performed not requiring image post processing on an independent workstation and was indeterminate.  LEFT VENTRICLE PLAX 2D LVIDd:         4.40 cm   Diastology LVIDs:         3.10 cm   LV e' medial:    6.85 cm/s LV PW:         1.30 cm   LV E/e' medial:  8.7 LV IVS:        1.20 cm   LV e' lateral:   6.74 cm/s LVOT diam:     2.40 cm   LV E/e' lateral: 8.9 LV SV:         95 LV SV Index:   55 LVOT Area:     4.52 cm  RIGHT VENTRICLE RV Basal diam:  3.70 cm RV S prime:     14.80 cm/s TAPSE (M-mode): 2.0 cm LEFT ATRIUM             Index        RIGHT ATRIUM           Index LA diam:        3.40 cm 1.99 cm/m   RA Area:     16.80 cm LA Vol (A2C):   36.7 ml 21.45 ml/m  RA Volume:   45.00 ml  26.30 ml/m LA Vol (A4C):   27.6 ml 16.13 ml/m LA Biplane Vol: 32.1 ml 18.76 ml/m  AORTIC VALVE                 PULMONIC VALVE AV Area (Vmax): 3.34 cm     PV Vmax:          1.15 m/s AV Vmax:        119.00 cm/s  PV Peak grad:     5.3 mmHg AV Peak Grad:   5.7 mmHg     PR End Diast Vel: 5.57 msec LVOT Vmax:      87.80 cm/s   RVOT Peak grad:   3 mmHg LVOT Vmean:     61.200 cm/s LVOT VTI:       0.209 m AI PHT:         810 msec  AORTA Ao Root diam: 3.70 cm MITRAL VALVE                TRICUSPID VALVE MV Area (PHT): 3.08 cm    TR Peak grad:   30.2 mmHg MV Decel Time: 246 msec    TR Vmax:        275.00 cm/s MV E velocity: 59.70 cm/s MV A velocity: 59.70 cm/s  SHUNTS MV E/A ratio:  1.00        Systemic VTI:  0.21 m                            Systemic Diam: 2.40 cm [employer] Electronically signed by Denyse Bathe Signature Date/Time: 08/30/2024/10:59:50 AM    Final    CT Head Wo Contrast Result Date: 08/29/2024 CLINICAL DATA:  Mental status change, unknown cause EXAM: CT HEAD WITHOUT CONTRAST TECHNIQUE: Contiguous axial images were obtained from the base of the skull through the vertex without intravenous contrast. RADIATION DOSE REDUCTION: This exam was performed according to the departmental dose-optimization program which includes automated exposure control, adjustment of the mA and/or kV according to patient size and/or use  of iterative reconstruction technique. COMPARISON:  None Available. FINDINGS: Brain: There is periventricular white matter decreased attenuation consistent with small vessel ischemic changes. Ventricles, sulci and cisterns are prominent consistent with age related involutional changes. No acute intracranial hemorrhage, mass effect or shift. No hydrocephalus. Vascular: No hyperdense vessel or unexpected calcification. Skull: Normal. Negative for fracture or focal lesion. Sinuses/Orbits: No acute finding. IMPRESSION: Atrophy and chronic small vessel ischemic changes. No acute intracranial process identified. Electronically Signed   By: Fonda Field M.D.   On: 08/29/2024 18:39   DG Chest Port 1 View Result Date: 08/29/2024 CLINICAL DATA:  Shortness of breath EXAM: PORTABLE CHEST 1 VIEW COMPARISON:  None Available. FINDINGS: There are mild patchy airspace opacities in both lower lobes. There is no pleural effusion or pneumothorax. Cardiomediastinal silhouette demonstrates mild cardiomegaly. There is no pneumothorax or acute fracture. IMPRESSION: Mild patchy airspace  opacities in both lower lobes, concerning for pneumonia. Follow-up PA and lateral chest x-ray recommended in 4-6 weeks to confirm resolution. Electronically Signed   By: Greig Pique M.D.   On: 08/29/2024 18:28     Medications:    azithromycin  500 mg (08/30/24 1722)   cefTRIAXone  (ROCEPHIN )  IV Stopped (08/30/24 1752)   lactated ringers       sodium chloride    Intravenous Once   allopurinol   100 mg Oral Daily   amLODipine   5 mg Oral Daily   donepezil   5 mg Oral QHS   guaiFENesin   600 mg Oral BID   heparin  injection (subcutaneous)  5,000 Units Subcutaneous Q8H   oseltamivir   30 mg Oral Daily   tamsulosin   0.4 mg Oral QPC supper   acetaminophen  **OR** acetaminophen , chlorpheniramine-HYDROcodone, ipratropium-albuterol , magnesium  hydroxide, ondansetron  **OR** ondansetron  (ZOFRAN ) IV, traZODone   Assessment/ Plan:  Mr. Adrian Michael is a 89 y.o.  male with past medical history of a fib, vascular demential, diabetes, and hypertension, who was admitted to [facility] on 08/29/2024 for Influenzal pneumonia [J11.00] Influenza A [J10.1] Acute respiratory failure with hypoxia (HCC) [J96.01]   Acute kidney injury on chronic kidney disease stage IIIb. Most recent outpatient creatinine 1.80 with GFR 34 on 04/10/2021. Patient was lost to follow-up. Creatinine 2.58 on admission. Concerned that due to dementia and comorbidities, patient will make a poor dialysis candidate long-term.  Creatinine has shown some improvement with IVF. Will continue for now with close monitoring of fluid status.   Lab Results  Component Value Date   CREATININE 2.18 (H) 08/31/2024   CREATININE 2.39 (H) 08/30/2024   CREATININE 2.58 (H) 08/29/2024    Intake/Output Summary (Last 24 hours) at 08/31/2024 1336 Last data filed at 08/31/2024 0900 Gross per 24 hour  Intake 3467.2 ml  Output 600 ml  Net 2867.2 ml   2.  Acute metabolic acidosis.  Serum bicarb 13 on admission.  Corrected with IV supplementation.   3.  Hypertension with  chronic kidney disease.  Home regimen includes losartan and amlodipine .  Receiving amlodipine  only.  Blood pressure remains stable    LOS: 2 Adrian Michael 9/2/20251:36 PM

## 2024-08-31 NOTE — Progress Notes (Signed)
 [facility] CLINIC CARDIOLOGY PROGRESS NOTE       Patient ID: Adrian Michael MRN: [id number] DOB/AGE: [birth year] 89 y.o.  Admit date: 08/29/2024 Referring Physician Dr. Lawence Primary Physician Godfrey Area, MD Primary Cardiologist None Reason for Consultation elevated troponin  HPI: Adrian Michael is a 89 y.o. male  with a past medical history of hypertension, hyperlipidemia, mixed Alzheimer's and vascular dementia, type 2 diabetes mellitus, CKD who presented to the ED on 08/29/2024 for worsening SOB and generalized weakness with associated decreased appetite, productive cough and fever.  Patient tested positive for influenza on Wednesday (08/27).  Troponins found to be elevated.  Cardiology was consulted for further evaluation.   Interval History: -Patient seen and examined this AM and laying comfortably in hospital bed. Patient very fatigued, only response to pain and loud voice, otherwise not answering questions.   -Patients BP and HR stable this AM. Overnight Tele showed no significant events.  -Patient remains on 3L with stable SpO2.    Review of systems complete and found to be negative unless listed above    History reviewed. No pertinent past medical history.  History reviewed. No pertinent surgical history.  Medications Prior to Admission  Medication Sig Dispense Refill Last Dose/Taking   allopurinol  (ZYLOPRIM ) 100 MG tablet Take 100 mg by mouth daily.   Taking   atenolol (TENORMIN) 25 MG tablet Take 25 mg by mouth daily.   Taking   Cholecalciferol (VITAMIN D-3) 125 MCG (5000 UT) TABS Take by mouth.   Taking   donepezil  (ARICEPT ) 10 MG tablet Take 10 mg by mouth daily.   Taking   Ginkgo Biloba 40 MG TABS Take by mouth.   Taking   levothyroxine  (SYNTHROID ) 50 MCG tablet Take 50 mcg by mouth daily.   Taking   losartan (COZAAR) 100 MG tablet Take 100 mg by mouth daily.   Taking   memantine  (NAMENDA ) 5 MG tablet Take by mouth.   Taking   metFORMIN (GLUCOPHAGE-XR) 750 MG 24  hr tablet Take 750 mg by mouth every evening. With dinner   Taking   mirtazapine  (REMERON ) 7.5 MG tablet Take 7.5 mg by mouth at bedtime.   Taking   Misc Natural Products (OSTEO BI-FLEX TRIPLE STRENGTH PO) Take by mouth.   Taking   Multiple Vitamins-Minerals (CENTRUM SILVER 50+MEN) TABS Take by mouth.   Taking   simvastatin  (ZOCOR ) 40 MG tablet Take 40 mg by mouth at bedtime.   Taking   tamsulosin  (FLOMAX ) 0.4 MG CAPS capsule Take 0.4 mg by mouth.   Taking   triamcinolone cream (KENALOG) 0.1 % Apply 1 application topically 2 (two) times daily.   Unknown   amLODipine  (NORVASC ) 5 MG tablet Take 5 mg by mouth daily.   Unknown   CANNABIDIOL PO Take by mouth.      Social History   Socioeconomic History   Marital status: Married    Spouse name: Not on file   Number of children: Not on file   Years of education: Not on file   Highest education level: Not on file  Occupational History   Not on file  Tobacco Use   Smoking status: Not on file   Smokeless tobacco: Not on file  Substance and Sexual Activity   Alcohol use: Not on file   Drug use: Not on file   Sexual activity: Not on file  Other Topics Concern   Not on file  Social History Narrative   Not on file   Social Drivers of Health  Financial Resource Strain: Not on file  Food Insecurity: No Food Insecurity (08/30/2024)   Hunger Vital Sign    Worried About Running Out of Food in the Last Year: Never true    Ran Out of Food in the Last Year: Never true  Transportation Needs: No Transportation Needs (08/30/2024)   PRAPARE - Administrator, Civil Service (Medical): No    Lack of Transportation (Non-Medical): No  Physical Activity: Not on file  Stress: Not on file  Social Connections: Patient Unable To Answer (08/30/2024)   Social Connection and Isolation Panel    Frequency of Communication with Friends and Family: Patient unable to answer    Frequency of Social Gatherings with Friends and Family: Patient unable to answer     Attends Religious Services: Patient unable to answer    Active Member of Clubs or Organizations: Patient unable to answer    Attends Banker Meetings: Patient unable to answer    Marital Status: Patient unable to answer  Intimate Partner Violence: Not At Risk (08/30/2024)   Humiliation, Afraid, Rape, and Kick questionnaire    Fear of Current or Ex-Partner: No    Emotionally Abused: No    Physically Abused: No    Sexually Abused: No    History reviewed. No pertinent family history.   Vitals:   08/30/24 1944 08/31/24 0008 08/31/24 0316 08/31/24 0931  BP: 138/61 (!) 118/52 (!) 108/48 (!) 118/56  Pulse: 71 91 87 73  Resp: 16 16 20 16   Temp: 97.9 F (36.6 C) 97.9 F (36.6 C) 98 F (36.7 C) 97.8 F (36.6 C)  TempSrc:  Axillary Oral Oral  SpO2: 99% 97% 94% 95%  Weight:      Height:        PHYSICAL EXAM General: Chronically ill-appearing elderly male, well nourished, in no acute distress. HEENT: Normocephalic and atraumatic. Neck: No JVD.   Lungs: Normal respiratory effort on 3L.  Diminished breath sounds bilaterally. Heart: HRRR. Normal S1 and S2 without gallops or murmurs.  Abdomen: Non-distended appearing.  Msk: Normal strength and tone for age. Extremities: Warm and well perfused. No clubbing, cyanosis, edema.  Neuro: Alert and oriented X 3. Psych: Answers questions appropriately.   Labs: Basic Metabolic Panel: Recent Labs    08/30/24 0458 08/31/24 0532  NA 141 143  K 5.1 4.1  CL 114* 108  CO2 17* 25  GLUCOSE 133* 100*  BUN 118* 122*  CREATININE 2.39* 2.18*  CALCIUM  8.5* 8.1*  MG  --  2.3   Liver Function Tests: Recent Labs    08/30/24 0458 08/31/24 0532  AST 69* 185*  ALT 57* 111*  ALKPHOS 80 79  BILITOT 0.5 0.6  PROT 5.1* 4.6*  ALBUMIN 2.5* 2.2*   No results for input(s): LIPASE, AMYLASE in the last 72 hours. CBC: Recent Labs    08/29/24 1801 08/30/24 0458 08/31/24 0532  WBC 5.6 7.5 7.4  NEUTROABS 4.0  --   --   HGB 9.8*  8.8* 7.2*  HCT 31.1* 27.2* 21.6*  MCV 107.2* 103.0* 100.5*  PLT 220 211 194   Cardiac Enzymes: Recent Labs    08/29/24 2102 08/29/24 2157 08/29/24 2305  TROPONINIHS 328* 331* 338*   BNP: Recent Labs    08/29/24 1801  BNP 413.4*   D-Dimer: No results for input(s): DDIMER in the last 72 hours. Hemoglobin A1C: No results for input(s): HGBA1C in the last 72 hours. Fasting Lipid Panel: No results for input(s): CHOL, HDL, LDLCALC, TRIG,  CHOLHDL, LDLDIRECT in the last 72 hours. Thyroid Function Tests: No results for input(s): TSH, T4TOTAL, T3FREE, THYROIDAB in the last 72 hours.  Invalid input(s): FREET3 Anemia Panel: Recent Labs    08/29/24 1800 08/30/24 0458  VITAMINB12 620  --   FERRITIN  --  340*  TIBC  --  209*  IRON   --  <10*     Radiology: ECHOCARDIOGRAM COMPLETE Result Date: 08/30/2024    ECHOCARDIOGRAM REPORT   Patient Name:   VUK SKILLERN Date of Exam: 08/30/2024 Medical Rec #:  [id number]   Height:       67.0 in Accession #:    [id number]  Weight:       134.9 lb Date of Birth:  08-19-1935   BSA:          1.711 m Patient Age:    89 years    BP:           134/58 mmHg Patient Gender: M           HR:           73 bpm. Exam Location:  [facility] Procedure: 2D Echo, Cardiac Doppler, Color Doppler and Strain Analysis (Both            Spectral and Color Flow Doppler were utilized during procedure). Indications:     Elevated Troponin  History:         Patient has no prior history of Echocardiogram examinations.  Sonographer:     Thedora Louder RDCS, FASE Referring Phys:  [id number] MADISON A MANSY Diagnosing Phys: Denyse Bathe  Sonographer Comments: Global longitudinal strain was attempted. Challenging study due to patient's low acoustic windows. IMPRESSIONS  1. Left ventricular ejection fraction, by estimation, is 60 to 65%. The left ventricle has normal function. The left ventricle has no regional wall motion abnormalities. Left ventricular diastolic parameters were  normal. The global longitudinal strain is normal.  2. Right ventricular systolic function is normal. The right ventricular size is normal.  3. The mitral valve is normal in structure. Mild mitral valve regurgitation. No evidence of mitral stenosis.  4. The aortic valve is normal in structure. Aortic valve regurgitation is mild to moderate. Aortic valve sclerosis/calcification is present, without any evidence of aortic stenosis.  5. The inferior vena cava is normal in size with greater than 50% respiratory variability, suggesting right atrial pressure of 3 mmHg. FINDINGS  Left Ventricle: Left ventricular ejection fraction, by estimation, is 60 to 65%. The left ventricle has normal function. The left ventricle has no regional wall motion abnormalities. Strain was performed and the global longitudinal strain is normal. The  left ventricular internal cavity size was normal in size. There is no left ventricular hypertrophy. Left ventricular diastolic parameters were normal. Right Ventricle: The right ventricular size is normal. No increase in right ventricular wall thickness. Right ventricular systolic function is normal. Left Atrium: Left atrial size was normal in size. Right Atrium: Right atrial size was normal in size. Pericardium: There is no evidence of pericardial effusion. Mitral Valve: The mitral valve is normal in structure. Mild mitral valve regurgitation. No evidence of mitral valve stenosis. Tricuspid Valve: The tricuspid valve is normal in structure. Tricuspid valve regurgitation is trivial. No evidence of tricuspid stenosis. Aortic Valve: The aortic valve is normal in structure. Aortic valve regurgitation is mild to moderate. Aortic regurgitation PHT measures 810 msec. Aortic valve sclerosis/calcification is present, without any evidence of aortic stenosis. Aortic valve peak  gradient measures 5.7 mmHg. Pulmonic Valve: The  pulmonic valve was normal in structure. Pulmonic valve regurgitation is not  visualized. No evidence of pulmonic stenosis. Aorta: The aortic root is normal in size and structure. Venous: The inferior vena cava is normal in size with greater than 50% respiratory variability, suggesting right atrial pressure of 3 mmHg. IAS/Shunts: No atrial level shunt detected by color flow Doppler. Additional Comments: 3D was performed not requiring image post processing on an independent workstation and was indeterminate.  LEFT VENTRICLE PLAX 2D LVIDd:         4.40 cm   Diastology LVIDs:         3.10 cm   LV e' medial:    6.85 cm/s LV PW:         1.30 cm   LV E/e' medial:  8.7 LV IVS:        1.20 cm   LV e' lateral:   6.74 cm/s LVOT diam:     2.40 cm   LV E/e' lateral: 8.9 LV SV:         95 LV SV Index:   55 LVOT Area:     4.52 cm  RIGHT VENTRICLE RV Basal diam:  3.70 cm RV S prime:     14.80 cm/s TAPSE (M-mode): 2.0 cm LEFT ATRIUM             Index        RIGHT ATRIUM           Index LA diam:        3.40 cm 1.99 cm/m   RA Area:     16.80 cm LA Vol (A2C):   36.7 ml 21.45 ml/m  RA Volume:   45.00 ml  26.30 ml/m LA Vol (A4C):   27.6 ml 16.13 ml/m LA Biplane Vol: 32.1 ml 18.76 ml/m  AORTIC VALVE                 PULMONIC VALVE AV Area (Vmax): 3.34 cm     PV Vmax:          1.15 m/s AV Vmax:        119.00 cm/s  PV Peak grad:     5.3 mmHg AV Peak Grad:   5.7 mmHg     PR End Diast Vel: 5.57 msec LVOT Vmax:      87.80 cm/s   RVOT Peak grad:   3 mmHg LVOT Vmean:     61.200 cm/s LVOT VTI:       0.209 m AI PHT:         810 msec  AORTA Ao Root diam: 3.70 cm MITRAL VALVE               TRICUSPID VALVE MV Area (PHT): 3.08 cm    TR Peak grad:   30.2 mmHg MV Decel Time: 246 msec    TR Vmax:        275.00 cm/s MV E velocity: 59.70 cm/s MV A velocity: 59.70 cm/s  SHUNTS MV E/A ratio:  1.00        Systemic VTI:  0.21 m                            Systemic Diam: 2.40 cm Denyse Bathe Electronically signed by Denyse Bathe Signature Date/Time: 08/30/2024/10:59:50 AM    Final    CT Head Wo Contrast Result Date:  08/29/2024 CLINICAL DATA:  Mental status change, unknown cause EXAM: CT HEAD WITHOUT CONTRAST TECHNIQUE: Contiguous axial  images were obtained from the base of the skull through the vertex without intravenous contrast. RADIATION DOSE REDUCTION: This exam was performed according to the departmental dose-optimization program which includes automated exposure control, adjustment of the mA and/or kV according to patient size and/or use of iterative reconstruction technique. COMPARISON:  None Available. FINDINGS: Brain: There is periventricular white matter decreased attenuation consistent with small vessel ischemic changes. Ventricles, sulci and cisterns are prominent consistent with age related involutional changes. No acute intracranial hemorrhage, mass effect or shift. No hydrocephalus. Vascular: No hyperdense vessel or unexpected calcification. Skull: Normal. Negative for fracture or focal lesion. Sinuses/Orbits: No acute finding. IMPRESSION: Atrophy and chronic small vessel ischemic changes. No acute intracranial process identified. Electronically Signed   By: Fonda Field M.D.   On: 08/29/2024 18:39   DG Chest Port 1 View Result Date: 08/29/2024 CLINICAL DATA:  Shortness of breath EXAM: PORTABLE CHEST 1 VIEW COMPARISON:  None Available. FINDINGS: There are mild patchy airspace opacities in both lower lobes. There is no pleural effusion or pneumothorax. Cardiomediastinal silhouette demonstrates mild cardiomegaly. There is no pneumothorax or acute fracture. IMPRESSION: Mild patchy airspace opacities in both lower lobes, concerning for pneumonia. Follow-up PA and lateral chest x-ray recommended in 4-6 weeks to confirm resolution. Electronically Signed   By: Greig Pique M.D.   On: 08/29/2024 18:28    ECHO as above.  TELEMETRY reviewed by me 08/31/2024: Sinus rhythm with PACs, rate 70s  EKG reviewed by me: SR with PACs, rate 79 bpm  Data reviewed by me 08/31/2024: last 24h vitals tele labs imaging I/O ED  provider note, admission H&P.  Principal Problem:   Influenzal pneumonia Active Problems:   Acute kidney injury superimposed on chronic kidney disease (HCC)   Elevated troponin I level   Essential hypertension   Gout   BPH (benign prostatic hyperplasia)   Mixed Alzheimer's and vascular dementia (HCC)   Elevated brain natriuretic peptide (BNP) level    ASSESSMENT AND PLAN:  Takoda Janowiak is a 89 y.o. male  with a past medical history of hypertension, hyperlipidemia, mixed Alzheimer's and vascular dementia, type 2 diabetes mellitus, CKD who presented to the ED on 08/29/2024 for worsening SOB and generalized weakness with associated decreased appetite, productive cough and fever.  Patient tested positive for influenza on Wednesday (08/27).  Troponins found to be elevated.  Cardiology was consulted for further evaluation.   # Influenza A pneumonia # Acute hypoxic respiratory failure # Mixed Alzheimer's and vascular dementia -IV antibiotics for PNA, management per primary team. -Recommend adequate p.o. intake.  # Demand ischemia # Hypertension # Hyperlipidemia Patient without chest pain.  Echo this admission with preserved EF, no RWMA.  EKG without acute ischemic changes.  Troponin elevated and flat, 328 > 331 > 338 in setting of conditions stated above.  -Continue amlodipine  5 mg daily. -Home Simvastatin  40 mg daily held due to elevated LFTs.  Resume when LFTs normalize. -Elevated and flat troponins is most consistent with demand/supply mismatch and not ACS. No further cardiac diagnostics at this time.  No further cardiac recommendations at this time. Cardiology will sign off. Please haiku with questions or re-engage if needed.   This patient's plan of care was discussed and created with Dr. Florencio and he is in agreement.  Signed: Dorene Comfort, PA-C  08/31/2024, 11:43 AM [facility] Cardiology

## 2024-08-31 NOTE — Care Management Important Message (Signed)
 Important Message  Patient Details  Name: Adrian Michael MRN: [id number] Date of Birth: 1935/09/09   Important Message Given:  Yes - Medicare IM     [name] [name] 08/31/2024, 12:04 PM

## 2024-08-31 NOTE — IPAL (Signed)
  Interdisciplinary Goals of Care Family Meeting   Date carried out: 08/31/2024  Location of the meeting: Phone conference  Member's involved: Physician and Family Member or next of kin  Durable Power of Attorney or acting medical decision maker: [name]  Hughes  Discussion: We discussed diagnosis, prognosis and goals of care for [employer] .    Code status:   Code Status: Do not attempt resuscitation (DNR) PRE-ARREST INTERVENTIONS DESIRED  Ms. V[place] said she is okay with mechanical intubation if required but she does not want chest compressions because patient is so frail and she is concerned about potential injuries from chest compressions such as fractured ribs and punctured lungs.  Disposition: Continue current acute care  Time spent for the meeting: 8 minutes    AIDA CHO, MD  08/31/2024, 10:34 AM

## 2024-08-31 NOTE — Progress Notes (Addendum)
 Progress Note    Adrian Michael  FMW:[id number] DOB: 05/05/1935  DOA: 08/29/2024 PCP: Godfrey Area, MD      Brief Narrative:    Medical records reviewed and are as summarized below:  Adrian Michael is a 89 y.o. male with medical history significant for hypertension, mixed Alzheimer's and vascular dementia, GERD, hypothyroidism, type II DM, CKD stage IIIb, gout, dyslipidemia, hyperparathyroidism, BPH, who presented to the hospital with generalized weakness, fever, poor appetite, productive cough and worsening shortness of breath.  Oxygen saturation was down into the 80s with EMS so he was placed on oxygen via nonrebreather mask and transported to the ED.  There is also report that he had tested positive for influenza on Wednesday, 08/25/2024.     Reportedly, patient had been living in [place] but was malnourished and had been recently admitted to the ICU.  He was discharged to a rehab facility where he spent several weeks.  His daughter decided to bring him to [place]  (about 6 days prior to admission) so that she could take care of him.  Chest x-ray showed mild patchy airspace opacities in both lower lobes, concerning for pneumonia.      Assessment/Plan:   Principal Problem:   Influenzal pneumonia Active Problems:   Acute kidney injury superimposed on chronic kidney disease (HCC)   Elevated troponin I level   Essential hypertension   Gout   BPH (benign prostatic hyperplasia)   Mixed Alzheimer's and vascular dementia (HCC)   Elevated brain natriuretic peptide (BNP) level    Body mass index is 21.13 kg/m.   Influenza A pneumonia, possibly secondary bacterial pneumonia as well: Continue IV ceftriaxone , IV azithromycin  and Tamiflu  No growth on blood cultures thus far.   Acute hypoxic respiratory failure: He is still on 3 L/min oxygen.  Wean off oxygen as able.  Initially on 15 L/min oxygen via [place] on admission.   AKI on CKD stage IIIb/stage IV with  metabolic acidosis: Improving.   Continue IV fluids and monitor BMP. Follow-up with nephrologist. BUN down from 143-118-100, creatinine down from 2.58-2.39-2.18, bicarb up from 13-17-25. [name], daughter, said patient has had stage IV kidney disease for some time now.  Of note, he has had fluctuating creatinine/GFR and actual stage is difficult to determine.   Hyperkalemia: Improved   Elevated troponins: 322, 328, 331, 338.  This is likely due to demand ischemia.  IV heparin  drip was discontinued on 07/30/2024. Elevated BNP (413): Likely from AKI on CKD 2D echo showed EF estimated at 60 to 65%, normal LV diastolic parameters, mild MR.   Type II DM with hyperglycemia: Monitor glucose level   Acute on chronic anemia, suspect underlying anemia of chronic kidney disease: Hemoglobin trended down from 9.8-8.8-7.2.   Worsening anemia may be due to chronic kidney disease.  No evidence of bleeding thus far. Discussed risks, and benefits of blood transfusion with patient's daughter, [name].  She consented to blood transfusion. Iron  studies: Ferritin 340, TIBC 209, iron  less than 10, saturation ratio could not be calculated. Vitamin B12 620   Alzheimer's and vascular dementia: Continue Aricept . Supportive care   General Weakness, ambulatory dysfunction: PT recommended discharge to SNF.  Follow-up with TOC to assist with disposition.   Comorbidities include BPH, gout, hypertension, hypothyroidism     Diet Order             DIET DYS 3 Room service appropriate? Yes; Fluid consistency: Thin  Diet effective now  Consultants: Nephrologist Cardiologist  Procedures: None    Medications:    sodium chloride    Intravenous Once   allopurinol   100 mg Oral Daily   amLODipine   5 mg Oral Daily   donepezil   5 mg Oral QHS   guaiFENesin   600 mg Oral BID   heparin  injection (subcutaneous)  5,000 Units Subcutaneous Q8H   oseltamivir   30  mg Oral Daily   tamsulosin   0.4 mg Oral QPC supper   Continuous Infusions:  azithromycin  500 mg (08/30/24 1722)   cefTRIAXone  (ROCEPHIN )  IV Stopped (08/30/24 1752)   lactated ringers        Anti-infectives (From admission, onward)    Start     Dose/Rate Route Frequency Ordered Stop   08/31/24 1000  oseltamivir  (TAMIFLU ) capsule 30 mg        30 mg Oral Daily 08/30/24 1402 09/04/24 0959   08/30/24 1800  cefTRIAXone  (ROCEPHIN ) 2 g in sodium chloride  0.9 % 100 mL IVPB        2 g 200 mL/hr over 30 Minutes Intravenous Every 24 hours 08/29/24 2115 09/04/24 1759   08/30/24 1800  azithromycin  (ZITHROMAX ) 500 mg in sodium chloride  0.9 % 250 mL IVPB        500 mg 250 mL/hr over 60 Minutes Intravenous Every 24 hours 08/29/24 2115 09/04/24 1759   08/29/24 2130  oseltamivir  (TAMIFLU ) capsule 75 mg  Status:  Discontinued        75 mg Oral 2 times daily 08/29/24 2115 08/30/24 1402   08/29/24 1845  cefTRIAXone  (ROCEPHIN ) 2 g in sodium chloride  0.9 % 100 mL IVPB        2 g 200 mL/hr over 30 Minutes Intravenous Once 08/29/24 1831 08/29/24 1928   08/29/24 1845  azithromycin  (ZITHROMAX ) 500 mg in sodium chloride  0.9 % 250 mL IVPB        500 mg 250 mL/hr over 60 Minutes Intravenous  Once 08/29/24 1831 08/29/24 2100              Family Communication/Anticipated D/C date and plan/Code Status   DVT prophylaxis: heparin  injection 5,000 Units Start: 08/31/24 1430 Place and maintain sequential compression device Start: 08/31/24 1337     Code Status: Do not attempt resuscitation (DNR) PRE-ARREST INTERVENTIONS DESIRED  Family Communication: Plan of care discussed with [name], daughter, over the phone Disposition Plan: Plan to discharge to SNF   Status is: Inpatient Remains inpatient appropriate because: Pneumonia, AKI and respiratory failure       Subjective:   Interval events noted.  No complaints.  However, he is confused and cannot provide adequate history  Objective:     Vitals:   08/30/24 1944 08/31/24 0008 08/31/24 0316 08/31/24 0931  BP: 138/61 (!) 118/52 (!) 108/48 (!) 118/56  Pulse: 71 91 87 73  Resp: 16 16 20 16   Temp: 97.9 F (36.6 C) 97.9 F (36.6 C) 98 F (36.7 C) 97.8 F (36.6 C)  TempSrc:  Axillary Oral Oral  SpO2: 99% 97% 94% 95%  Weight:      Height:       No data found.   Intake/Output Summary (Last 24 hours) at 08/31/2024 1338 Last data filed at 08/31/2024 0900 Gross per 24 hour  Intake 3467.2 ml  Output 600 ml  Net 2867.2 ml   Filed Weights   08/29/24 1805 08/29/24 2112  Weight: 61.2 kg 61.2 kg    Exam:  GEN: NAD SKIN: Warm and dry EYES: Pale, anicteric ENT: MMM CV: RRR PULM: Bibasilar  rales, no wheezing ABD: soft, ND, NT, +BS CNS: AAO x 1 (person), non focal EXT: No edema or tenderness       Data Reviewed:   I have personally reviewed following labs and imaging studies:  Labs: Labs show the following:   Basic Metabolic Panel: Recent Labs  Lab 08/29/24 1801 08/30/24 0458 08/31/24 0532  NA 141 141 143  K 5.7* 5.1 4.1  CL 117* 114* 108  CO2 13* 17* 25  GLUCOSE 252* 133* 100*  BUN 143* 118* 122*  CREATININE 2.58* 2.39* 2.18*  CALCIUM  8.5* 8.5* 8.1*  MG  --   --  2.3   GFR Estimated Creatinine Clearance: 20.7 mL/min (A) (by C-G formula based on SCr of 2.18 mg/dL (H)). Liver Function Tests: Recent Labs  Lab 08/29/24 1801 08/30/24 0458 08/31/24 0532  AST 72* 69* 185*  ALT 69* 57* 111*  ALKPHOS 107 80 79  BILITOT 0.4 0.5 0.6  PROT 6.4* 5.1* 4.6*  ALBUMIN 3.2* 2.5* 2.2*   No results for input(s): LIPASE, AMYLASE in the last 168 hours. No results for input(s): AMMONIA in the last 168 hours. Coagulation profile Recent Labs  Lab 08/29/24 2157  INR 1.1    CBC: Recent Labs  Lab 08/29/24 1801 08/30/24 0458 08/31/24 0532  WBC 5.6 7.5 7.4  NEUTROABS 4.0  --   --   HGB 9.8* 8.8* 7.2*  HCT 31.1* 27.2* 21.6*  MCV 107.2* 103.0* 100.5*  PLT 220 211 194   Cardiac Enzymes: No  results for input(s): CKTOTAL, CKMB, CKMBINDEX, TROPONINI in the last 168 hours. BNP (last 3 results) No results for input(s): PROBNP in the last 8760 hours. CBG: No results for input(s): GLUCAP in the last 168 hours. D-Dimer: No results for input(s): DDIMER in the last 72 hours. Hgb A1c: No results for input(s): HGBA1C in the last 72 hours. Lipid Profile: No results for input(s): CHOL, HDL, LDLCALC, TRIG, CHOLHDL, LDLDIRECT in the last 72 hours. Thyroid function studies: No results for input(s): TSH, T4TOTAL, T3FREE, THYROIDAB in the last 72 hours.  Invalid input(s): FREET3 Anemia work up: Recent Labs    08/29/24 1800 08/30/24 0458  VITAMINB12 620  --   FERRITIN  --  340*  TIBC  --  209*  IRON   --  <10*   Sepsis Labs: Recent Labs  Lab 08/29/24 1801 08/29/24 2102 08/30/24 0458 08/31/24 0532  WBC 5.6  --  7.5 7.4  LATICACIDVEN 2.0* 1.9  --   --     Microbiology Recent Results (from the past 240 hours)  Culture, blood (routine x 2)     Status: None (Preliminary result)   Collection Time: 08/29/24  6:00 PM   Specimen: BLOOD  Result Value Ref Range Status   Specimen Description BLOOD BLOOD LEFT FOREARM  Final   Special Requests   Final    BOTTLES DRAWN AEROBIC AND ANAEROBIC Blood Culture results may not be optimal due to an inadequate volume of blood received in culture bottles   Culture   Final    NO GROWTH 2 DAYS Performed at [facility], [address], [place], [place] [address]    Report Status PENDING  Incomplete  Culture, blood (routine x 2)     Status: None (Preliminary result)   Collection Time: 08/29/24  6:00 PM   Specimen: BLOOD  Result Value Ref Range Status   Specimen Description BLOOD BLOOD RIGHT FOREARM  Final   Special Requests   Final    BOTTLES DRAWN AEROBIC  AND ANAEROBIC Blood Culture results may not be optimal due to an inadequate volume of blood received in culture bottles   Culture   Final     NO GROWTH 2 DAYS Performed at [facility], [address] [place], [place] [address]    Report Status PENDING  Incomplete  Resp panel by RT-PCR (RSV, Flu A&B, Covid) Anterior Nasal Swab     Status: Abnormal   Collection Time: 08/29/24  6:01 PM   Specimen: Anterior Nasal Swab  Result Value Ref Range Status   SARS Coronavirus 2 by RT PCR NEGATIVE NEGATIVE Final    Comment: (NOTE) SARS-CoV-2 target nucleic acids are NOT DETECTED.  The SARS-CoV-2 RNA is generally detectable in upper respiratory specimens during the acute phase of infection. The lowest concentration of SARS-CoV-2 viral copies this assay can detect is 138 copies/mL. A negative result does not preclude SARS-Cov-2 infection and should not be used as the sole basis for treatment or other patient management decisions. A negative result may occur with  improper specimen collection/handling, submission of specimen other than nasopharyngeal swab, presence of viral mutation(s) within the areas targeted by this assay, and inadequate number of viral copies(<138 copies/mL). A negative result must be combined with clinical observations, patient history, and epidemiological information. The expected result is Negative.  Fact Sheet for Patients:  BloggerCourse.com  Fact Sheet for Healthcare Providers:  SeriousBroker.it  This test is no t yet approved or cleared by the [place]  FDA and  has been authorized for detection and/or diagnosis of SARS-CoV-2 by FDA under an Emergency Use Authorization (EUA). This EUA will remain  in effect (meaning this test can be used) for the duration of the COVID-19 declaration under Section 564(b)(1) of the Act, 21 U.S.C.section 360bbb-3(b)(1), unless the authorization is terminated  or revoked sooner.       Influenza A by PCR POSITIVE (A) NEGATIVE Final   Influenza B by PCR NEGATIVE NEGATIVE Final    Comment: (NOTE) The Xpert  Xpress SARS-CoV-2/FLU/RSV plus assay is intended as an aid in the diagnosis of influenza from Nasopharyngeal swab specimens and should not be used as a sole basis for treatment. Nasal washings and aspirates are unacceptable for Xpert Xpress SARS-CoV-2/FLU/RSV testing.  Fact Sheet for Patients: BloggerCourse.com  Fact Sheet for Healthcare Providers: SeriousBroker.it  This test is not yet approved or cleared by the [place]  FDA and has been authorized for detection and/or diagnosis of SARS-CoV-2 by FDA under an Emergency Use Authorization (EUA). This EUA will remain in effect (meaning this test can be used) for the duration of the COVID-19 declaration under Section 564(b)(1) of the Act, 21 U.S.C. section 360bbb-3(b)(1), unless the authorization is terminated or revoked.     Resp Syncytial Virus by PCR NEGATIVE NEGATIVE Final    Comment: (NOTE) Fact Sheet for Patients: BloggerCourse.com  Fact Sheet for Healthcare Providers: SeriousBroker.it  This test is not yet approved or cleared by the [place]  FDA and has been authorized for detection and/or diagnosis of SARS-CoV-2 by FDA under an Emergency Use Authorization (EUA). This EUA will remain in effect (meaning this test can be used) for the duration of the COVID-19 declaration under Section 564(b)(1) of the Act, 21 U.S.C. section 360bbb-3(b)(1), unless the authorization is terminated or revoked.  Performed at [facility], [address], [place], [place] [address]     Procedures and diagnostic studies:  ECHOCARDIOGRAM COMPLETE Result Date: 08/30/2024    ECHOCARDIOGRAM REPORT   Patient Name:   Adrian Michael  Date of Exam: 08/30/2024 Medical Rec #:  [id number]   Height:       67.0 in Accession #:    [id number]  Weight:       134.9 lb Date of Birth:  1935/03/19   BSA:          1.711 m Patient Age:    89 years    BP:            134/58 mmHg Patient Gender: M           HR:           73 bpm. Exam Location:  [facility] Procedure: 2D Echo, Cardiac Doppler, Color Doppler and Strain Analysis (Both            Spectral and Color Flow Doppler were utilized during procedure). Indications:     Elevated Troponin  History:         Patient has no prior history of Echocardiogram examinations.  Sonographer:     Thedora Louder RDCS, FASE Referring Phys:  [id number] MADISON A MANSY Diagnosing Phys: Denyse Bathe  Sonographer Comments: Global longitudinal strain was attempted. Challenging study due to patient's low acoustic windows. IMPRESSIONS  1. Left ventricular ejection fraction, by estimation, is 60 to 65%. The left ventricle has normal function. The left ventricle has no regional wall motion abnormalities. Left ventricular diastolic parameters were normal. The global longitudinal strain is normal.  2. Right ventricular systolic function is normal. The right ventricular size is normal.  3. The mitral valve is normal in structure. Mild mitral valve regurgitation. No evidence of mitral stenosis.  4. The aortic valve is normal in structure. Aortic valve regurgitation is mild to moderate. Aortic valve sclerosis/calcification is present, without any evidence of aortic stenosis.  5. The inferior vena cava is normal in size with greater than 50% respiratory variability, suggesting right atrial pressure of 3 mmHg. FINDINGS  Left Ventricle: Left ventricular ejection fraction, by estimation, is 60 to 65%. The left ventricle has normal function. The left ventricle has no regional wall motion abnormalities. Strain was performed and the global longitudinal strain is normal. The  left ventricular internal cavity size was normal in size. There is no left ventricular hypertrophy. Left ventricular diastolic parameters were normal. Right Ventricle: The right ventricular size is normal. No increase in right ventricular wall thickness. Right ventricular systolic function is  normal. Left Atrium: Left atrial size was normal in size. Right Atrium: Right atrial size was normal in size. Pericardium: There is no evidence of pericardial effusion. Mitral Valve: The mitral valve is normal in structure. Mild mitral valve regurgitation. No evidence of mitral valve stenosis. Tricuspid Valve: The tricuspid valve is normal in structure. Tricuspid valve regurgitation is trivial. No evidence of tricuspid stenosis. Aortic Valve: The aortic valve is normal in structure. Aortic valve regurgitation is mild to moderate. Aortic regurgitation PHT measures 810 msec. Aortic valve sclerosis/calcification is present, without any evidence of aortic stenosis. Aortic valve peak  gradient measures 5.7 mmHg. Pulmonic Valve: The pulmonic valve was normal in structure. Pulmonic valve regurgitation is not visualized. No evidence of pulmonic stenosis. Aorta: The aortic root is normal in size and structure. Venous: The inferior vena cava is normal in size with greater than 50% respiratory variability, suggesting right atrial pressure of 3 mmHg. IAS/Shunts: No atrial level shunt detected by color flow Doppler. Additional Comments: 3D was performed not requiring image post processing on an independent workstation and was indeterminate.  LEFT VENTRICLE PLAX 2D LVIDd:  4.40 cm   Diastology LVIDs:         3.10 cm   LV e' medial:    6.85 cm/s LV PW:         1.30 cm   LV E/e' medial:  8.7 LV IVS:        1.20 cm   LV e' lateral:   6.74 cm/s LVOT diam:     2.40 cm   LV E/e' lateral: 8.9 LV SV:         95 LV SV Index:   55 LVOT Area:     4.52 cm  RIGHT VENTRICLE RV Basal diam:  3.70 cm RV S prime:     14.80 cm/s TAPSE (M-mode): 2.0 cm LEFT ATRIUM             Index        RIGHT ATRIUM           Index LA diam:        3.40 cm 1.99 cm/m   RA Area:     16.80 cm LA Vol (A2C):   36.7 ml 21.45 ml/m  RA Volume:   45.00 ml  26.30 ml/m LA Vol (A4C):   27.6 ml 16.13 ml/m LA Biplane Vol: 32.1 ml 18.76 ml/m  AORTIC VALVE                  PULMONIC VALVE AV Area (Vmax): 3.34 cm     PV Vmax:          1.15 m/s AV Vmax:        119.00 cm/s  PV Peak grad:     5.3 mmHg AV Peak Grad:   5.7 mmHg     PR End Diast Vel: 5.57 msec LVOT Vmax:      87.80 cm/s   RVOT Peak grad:   3 mmHg LVOT Vmean:     61.200 cm/s LVOT VTI:       0.209 m AI PHT:         810 msec  AORTA Ao Root diam: 3.70 cm MITRAL VALVE               TRICUSPID VALVE MV Area (PHT): 3.08 cm    TR Peak grad:   30.2 mmHg MV Decel Time: 246 msec    TR Vmax:        275.00 cm/s MV E velocity: 59.70 cm/s MV A velocity: 59.70 cm/s  SHUNTS MV E/A ratio:  1.00        Systemic VTI:  0.21 m                            Systemic Diam: 2.40 cm [employer] Electronically signed by Denyse Bathe Signature Date/Time: 08/30/2024/10:59:50 AM    Final    CT Head Wo Contrast Result Date: 08/29/2024 CLINICAL DATA:  Mental status change, unknown cause EXAM: CT HEAD WITHOUT CONTRAST TECHNIQUE: Contiguous axial images were obtained from the base of the skull through the vertex without intravenous contrast. RADIATION DOSE REDUCTION: This exam was performed according to the departmental dose-optimization program which includes automated exposure control, adjustment of the mA and/or kV according to patient size and/or use of iterative reconstruction technique. COMPARISON:  None Available. FINDINGS: Brain: There is periventricular white matter decreased attenuation consistent with small vessel ischemic changes. Ventricles, sulci and cisterns are prominent consistent with age related involutional changes. No acute intracranial hemorrhage, mass effect or shift. No hydrocephalus. Vascular:  No hyperdense vessel or unexpected calcification. Skull: Normal. Negative for fracture or focal lesion. Sinuses/Orbits: No acute finding. IMPRESSION: Atrophy and chronic small vessel ischemic changes. No acute intracranial process identified. Electronically Signed   By: Fonda Field M.D.   On: 08/29/2024 18:39   DG Chest Port 1  View Result Date: 08/29/2024 CLINICAL DATA:  Shortness of breath EXAM: PORTABLE CHEST 1 VIEW COMPARISON:  None Available. FINDINGS: There are mild patchy airspace opacities in both lower lobes. There is no pleural effusion or pneumothorax. Cardiomediastinal silhouette demonstrates mild cardiomegaly. There is no pneumothorax or acute fracture. IMPRESSION: Mild patchy airspace opacities in both lower lobes, concerning for pneumonia. Follow-up PA and lateral chest x-ray recommended in 4-6 weeks to confirm resolution. Electronically Signed   By: Greig Pique M.D.   On: 08/29/2024 18:28               LOS: 2 days   Suraj Ramdass  [facility]   Pager on [email or web address]. If 7PM-7AM, please contact night-coverage at [email or web address]     08/31/2024, 1:38 PM

## 2024-09-01 DIAGNOSIS — J9601 Acute respiratory failure with hypoxia: Secondary | ICD-10-CM | POA: Diagnosis not present

## 2024-09-01 DIAGNOSIS — D631 Anemia in chronic kidney disease: Secondary | ICD-10-CM

## 2024-09-01 DIAGNOSIS — D649 Anemia, unspecified: Secondary | ICD-10-CM

## 2024-09-01 DIAGNOSIS — J101 Influenza due to other identified influenza virus with other respiratory manifestations: Secondary | ICD-10-CM

## 2024-09-01 DIAGNOSIS — I479 Paroxysmal tachycardia, unspecified: Secondary | ICD-10-CM | POA: Diagnosis not present

## 2024-09-01 DIAGNOSIS — J1100 Influenza due to unidentified influenza virus with unspecified type of pneumonia: Secondary | ICD-10-CM | POA: Diagnosis not present

## 2024-09-01 DIAGNOSIS — N179 Acute kidney failure, unspecified: Secondary | ICD-10-CM | POA: Diagnosis not present

## 2024-09-01 DIAGNOSIS — R000 Tachycardia, unspecified: Secondary | ICD-10-CM | POA: Diagnosis not present

## 2024-09-01 DIAGNOSIS — E870 Hyperosmolality and hypernatremia: Secondary | ICD-10-CM

## 2024-09-01 DIAGNOSIS — E43 Unspecified severe protein-calorie malnutrition: Secondary | ICD-10-CM

## 2024-09-01 DIAGNOSIS — I499 Cardiac arrhythmia, unspecified: Secondary | ICD-10-CM

## 2024-09-01 DIAGNOSIS — I493 Ventricular premature depolarization: Secondary | ICD-10-CM | POA: Diagnosis not present

## 2024-09-01 DIAGNOSIS — G309 Alzheimer's disease, unspecified: Secondary | ICD-10-CM | POA: Diagnosis not present

## 2024-09-01 DIAGNOSIS — R7989 Other specified abnormal findings of blood chemistry: Secondary | ICD-10-CM | POA: Diagnosis not present

## 2024-09-01 DIAGNOSIS — L8990 Pressure ulcer of unspecified site, unspecified stage: Secondary | ICD-10-CM

## 2024-09-01 DIAGNOSIS — Z515 Encounter for palliative care: Secondary | ICD-10-CM

## 2024-09-01 DIAGNOSIS — N400 Enlarged prostate without lower urinary tract symptoms: Secondary | ICD-10-CM

## 2024-09-01 DIAGNOSIS — N184 Chronic kidney disease, stage 4 (severe): Secondary | ICD-10-CM

## 2024-09-01 LAB — CBC
HCT: 22.1 % — ABNORMAL LOW (ref 39.0–52.0)
Hemoglobin: 7.2 g/dL — ABNORMAL LOW (ref 13.0–17.0)
MCH: 32.3 pg (ref 26.0–34.0)
MCHC: 32.6 g/dL (ref 30.0–36.0)
MCV: 99.1 fL (ref 80.0–100.0)
Platelets: 171 K/uL (ref 150–400)
RBC: 2.23 MIL/uL — ABNORMAL LOW (ref 4.22–5.81)
RDW: 16.3 % — ABNORMAL HIGH (ref 11.5–15.5)
WBC: 6.7 K/uL (ref 4.0–10.5)
nRBC: 0 % (ref 0.0–0.2)

## 2024-09-01 LAB — BASIC METABOLIC PANEL WITH GFR
Anion gap: 10 (ref 5–15)
BUN: 126 mg/dL — ABNORMAL HIGH (ref 8–23)
CO2: 27 mmol/L (ref 22–32)
Calcium: 8.1 mg/dL — ABNORMAL LOW (ref 8.9–10.3)
Chloride: 111 mmol/L (ref 98–111)
Creatinine, Ser: 2.01 mg/dL — ABNORMAL HIGH (ref 0.61–1.24)
GFR, Estimated: 32 mL/min — ABNORMAL LOW (ref >60)
Glucose, Bld: 89 mg/dL (ref 70–99)
Potassium: 4.1 mmol/L (ref 3.5–5.1)
Sodium: 148 mmol/L — ABNORMAL HIGH (ref 135–145)

## 2024-09-01 LAB — MRSA NEXT GEN BY PCR, NASAL: MRSA by PCR Next Gen: DETECTED — AB

## 2024-09-01 LAB — PREPARE RBC (CROSSMATCH)

## 2024-09-01 LAB — FOLATE: Folate: 9.1 ng/mL (ref >5.9)

## 2024-09-01 MED ORDER — CHLORHEXIDINE GLUCONATE CLOTH 2 % EX PADS
6.0000 | MEDICATED_PAD | Freq: Every day | CUTANEOUS | Status: DC
  Administered 2024-09-02 – 2024-09-03 (×2): 6 via TOPICAL

## 2024-09-01 MED ORDER — FE FUM-VIT C-VIT B12-FA 460-60-0.01-1 MG PO CAPS
1.0000 | ORAL_CAPSULE | Freq: Two times a day (BID) | ORAL | Status: DC
  Administered 2024-09-03: 1 via ORAL
  Filled 2024-09-01 (×13): qty 1

## 2024-09-01 MED ORDER — AMIODARONE LOAD VIA INFUSION
150.0000 mg | Freq: Once | INTRAVENOUS | Status: AC
  Administered 2024-09-01: 150 mg via INTRAVENOUS
  Filled 2024-09-01: qty 83.34

## 2024-09-01 MED ORDER — DARBEPOETIN ALFA 40 MCG/0.4ML IJ SOSY
30.0000 ug | PREFILLED_SYRINGE | Freq: Once | INTRAMUSCULAR | Status: AC
  Administered 2024-09-01: 30 ug via SUBCUTANEOUS
  Filled 2024-09-01: qty 0.4

## 2024-09-01 MED ORDER — AMIODARONE HCL IN DEXTROSE 360-4.14 MG/200ML-% IV SOLN
30.0000 mg/h | INTRAVENOUS | Status: DC
  Administered 2024-09-02 – 2024-09-03 (×4): 30 mg/h via INTRAVENOUS
  Filled 2024-09-01 (×3): qty 200

## 2024-09-01 MED ORDER — DEXTROSE 5 % IV SOLN: INTRAVENOUS | Status: DC

## 2024-09-01 MED ORDER — ZINC SULFATE 220 (50 ZN) MG PO CAPS
220.0000 mg | ORAL_CAPSULE | Freq: Every day | ORAL | Status: DC
  Administered 2024-09-02 – 2024-09-03 (×2): 220 mg via ORAL
  Filled 2024-09-01 (×4): qty 1

## 2024-09-01 MED ORDER — ENSURE PLUS HIGH PROTEIN PO LIQD
237.0000 mL | Freq: Three times a day (TID) | ORAL | Status: DC
  Administered 2024-09-01 – 2024-09-06 (×7): 237 mL via ORAL

## 2024-09-01 MED ORDER — MIRTAZAPINE 15 MG PO TABS
7.5000 mg | ORAL_TABLET | Freq: Every day | ORAL | Status: DC
  Administered 2024-09-02 – 2024-09-03 (×2): 7.5 mg via ORAL
  Filled 2024-09-01 (×2): qty 1

## 2024-09-01 MED ORDER — AMIODARONE HCL IN DEXTROSE 360-4.14 MG/200ML-% IV SOLN
60.0000 mg/h | INTRAVENOUS | Status: DC
  Administered 2024-09-01 (×2): 60 mg/h via INTRAVENOUS
  Filled 2024-09-01: qty 200

## 2024-09-01 MED ORDER — ADULT MULTIVITAMIN W/MINERALS CH
1.0000 | ORAL_TABLET | Freq: Every day | ORAL | Status: DC
  Administered 2024-09-01 – 2024-09-03 (×3): 1 via ORAL
  Filled 2024-09-01 (×4): qty 1

## 2024-09-01 MED ORDER — LEVOTHYROXINE SODIUM 50 MCG PO TABS
50.0000 ug | ORAL_TABLET | Freq: Every day | ORAL | Status: DC
  Administered 2024-09-02 – 2024-09-05 (×4): 50 ug via ORAL
  Filled 2024-09-01 (×4): qty 1

## 2024-09-01 MED ORDER — PROSOURCE PLUS PO LIQD
30.0000 mL | Freq: Two times a day (BID) | ORAL | Status: DC
  Administered 2024-09-02 – 2024-09-03 (×4): 30 mL via ORAL

## 2024-09-01 MED ORDER — VITAMIN C 500 MG PO TABS
500.0000 mg | ORAL_TABLET | Freq: Two times a day (BID) | ORAL | Status: DC
  Administered 2024-09-01 – 2024-09-03 (×5): 500 mg via ORAL
  Filled 2024-09-01 (×7): qty 1

## 2024-09-01 MED ORDER — MEMANTINE HCL 10 MG PO TABS
5.0000 mg | ORAL_TABLET | Freq: Every day | ORAL | Status: DC
  Administered 2024-09-02 – 2024-09-03 (×2): 5 mg via ORAL
  Filled 2024-09-01 (×5): qty 1

## 2024-09-01 MED ORDER — MUPIROCIN 2 % EX OINT
1.0000 | TOPICAL_OINTMENT | Freq: Two times a day (BID) | CUTANEOUS | Status: DC
  Administered 2024-09-02 – 2024-09-04 (×6): 1 via NASAL
  Filled 2024-09-01: qty 22

## 2024-09-01 MED ORDER — SODIUM CHLORIDE 0.9% IV SOLUTION: Freq: Once | INTRAVENOUS | Status: AC

## 2024-09-01 MED ORDER — CHLORHEXIDINE GLUCONATE CLOTH 2 % EX PADS
6.0000 | MEDICATED_PAD | Freq: Every day | CUTANEOUS | Status: DC
  Administered 2024-09-01: 6 via TOPICAL

## 2024-09-01 MED ORDER — LACTATED RINGERS IV BOLUS
500.0000 mL | Freq: Once | INTRAVENOUS | Status: AC
  Administered 2024-09-01: 500 mL via INTRAVENOUS

## 2024-09-01 MED ORDER — SIMVASTATIN 20 MG PO TABS
20.0000 mg | ORAL_TABLET | Freq: Every day | ORAL | Status: DC
  Administered 2024-09-02 – 2024-09-03 (×2): 20 mg via ORAL
  Filled 2024-09-01 (×3): qty 1

## 2024-09-01 NOTE — Progress Notes (Signed)
 Progress Note   Patient: Adrian Michael FMW:[id number] DOB: 27-Oct-1935 DOA: 08/29/2024     3 DOS: the patient was seen and examined on 09/01/2024   Brief hospital course: Taken from prior notes.  Adrian Michael is a 89 y.o. male with medical history significant for hypertension, mixed Alzheimer's and vascular dementia, GERD, hypothyroidism, type II DM, CKD stage IIIb, gout, dyslipidemia, hyperparathyroidism, BPH, who presented to the hospital with generalized weakness, fever, poor appetite, productive cough and worsening shortness of breath.  Oxygen saturation was down into the 80s with EMS so he was placed on oxygen via nonrebreather mask and transported to the ED.  There is also report that he had tested positive for influenza on Wednesday, 08/25/2024.      Reportedly, patient had been living in [place] but was malnourished and had been recently admitted to the ICU.  He was discharged to a rehab facility where he spent several weeks.  His daughter decided to bring him to   (about 6 days prior to admission) so that she could take care of him.   Chest x-ray showed mild patchy airspace opacities in both lower lobes, concerning for pneumonia.  9/3: Hemodynamically stable, on 3 L of oxygen.  PT is recommending SNF.  Improving renal function.  Mild hypernatremia today with sodium of 148 and water  deficit of 1.7L Started on D5. Lengthy discussion with daughter, apparently patient is declining, he was becoming mostly bedbound since November 2024, few hospitalizations due to concern of severe dehydration and hypotension.  Refusing most of the p.o. intake and losing weight, since April 2025 he is spending most of his time in bed and sleeping.  Daughter wants him to go to [facility] unit and is communicating with them.  TOC is aware Consulting palliative and dietitian.  Assessment and Plan: * Influenzal pneumonia Blood cultures remain negative and respiratory panel positive for influenza  A. - Continue ceftriaxone  and Zithromax  to complete a 5-day course for concern of superadded bacterial pneumonia - Continue with Tamiflu    Acute hypoxic respiratory failure (HCC) Likely secondary to influenza pneumonia, no baseline oxygen use.  Initially required up to 15 L of oxygen, currently on 3 L. - Continue supplemental oxygen-wean as tolerated  Acute kidney injury superimposed on chronic kidney disease (HCC) Patient has an history of CKD stage IV. Improving renal function with creatinine at 2.01 today.  Seems to be around baseline now.  Metabolic acidosis has been resolved.  Nephrology is on board.  -Monitor renal function -Avoid nephrotoxins  Elevated troponin I level Likely demand ischemia.  Echocardiogram was normal. Cardiology was consulted-no need for any further investigation so cardiology signed off.  Essential hypertension Blood pressure currently within goal. -Continue with amlodipine  -Keep holding losartan  Hypernatremia Developed hypernatremia with free water  deficit of about 1.5 and sodium of 148 today. -Giving some D5 -Monitor sodium Encourage hydration-  Anemia Hemoglobin of 7.2 s/p 1 unit of PRBC.  No obvious bleeding.  Anemia panel with anemia of chronic disease and some iron  deficiency.  B12 was normal and folate was not done. - Starting on supplement -Giving some Aranesp  -Check folate level -Monitor hemoglobin  Elevated brain natriuretic peptide (BNP) level BNP of 413 on admission, clinically appears dry.  Echocardiogram was normal.  Likely due to CKD and respiratory distress with influenza pneumonia. - Monitor volume status closely  Mixed Alzheimer's and vascular dementia (HCC) - Will continue Aricept  and Namenda  -Patient oriented to name at baseline  Protein-calorie malnutrition, severe Estimated body mass  index is 21.13 kg/m as calculated from the following:   Height as of this encounter: 5' 7 (1.702 m).   Weight as of this encounter:  61.2 kg.   - Dietitian consult  Gout - Will continue allopurinol .  BPH (benign prostatic hyperplasia) - Will continue Flomax .  Pressure injury of skin - Frequent position change        Subjective: Patient was minimally communicative when seen today.  Appears lethargic and stating not to pain.  Oriented to name only.  Physical Exam: Vitals:   09/01/24 0724 09/01/24 0757 09/01/24 1149 09/01/24 1210  BP: (!) 113/57  118/68   Pulse: 75  78   Resp: (!) 21 15 18    Temp: 98 F (36.7 C)  (!) 97.2 F (36.2 C) 97.7 F (36.5 C)  TempSrc: Oral   Axillary  SpO2: 98%  100%   Weight:      Height:       General.  Frail, malnourished and ill-appearing elderly man, in no acute distress. Pulmonary.  Lungs clear bilaterally, normal respiratory effort. CV.  Regular rate and rhythm, no JVD, rub or murmur. Abdomen.  Soft, nontender, nondistended, BS positive. CNS.  Somnolent but arousable and oriented to name only.  No focal neurologic deficit. Extremities.  No edema,  pulses intact and symmetrical. Psychiatry.  Judgment and insight appears impaired.  Data Reviewed: Prior data reviewed  Family Communication: Discussed with daughter on phone  Disposition: Status is: Inpatient Remains inpatient appropriate because: Severity of illness  Planned Discharge Destination: Skilled nursing facility  DVT prophylaxis.  Subcu heparin  Time spent: 50 minutes  This record has been created using Conservation officer, historic buildings. Errors have been sought and corrected,but may not always be located. Such creation errors do not reflect on the standard of care.   Author: Amaryllis Dare, MD 09/01/2024 2:02 PM  For on call review [email or web address].

## 2024-09-01 NOTE — Progress Notes (Signed)
 Occupational Therapy Treatment Patient Details Name: Adrian Michael MRN: [id number] DOB: 07-13-1935 Today's Date: 09/01/2024   History of present illness Pt is an 89 y.o. male presenting to hospital 08/29/24 with c/o respiratory distress (SOB and weakness).   Pt was living in [place] (recently admitted to ICU and discharged to rehab faclity) and came to [facility] about 6 days prior to admission.  Pt admitted with influenza PNA, AKI superimposed on CKD, elevated troponin level (likely d/t demand ischemia).  PMH includes advanced dementia, CKD, a-fib, htn, DM, gout.   OT comments  Chart reviewed to date, pt greeted in bed, obtunded and requiring significant simulation/position changes to open his eyes. Vitals monitored and appear WNL. MAX A required for supine>sit, MAX-TOTAL A for static sitting balance- 3 attempts. Pt attempts to return to bed each attempts at sitting up requiring MOD A. MAX A for feeding tasks. MAX-TOTAL A for rolling. Pt is left in modified chair position, all needs met. OT will continue to follow.       If plan is discharge home, recommend the following:  Two people to help with walking and/or transfers;Two people to help with bathing/dressing/bathroom   Equipment Recommendations  Other (comment) (defer to next venue of care)    Recommendations for Other Services      Precautions / Restrictions Precautions Precautions: Fall Recall of Precautions/Restrictions: Impaired Restrictions Weight Bearing Restrictions Per Provider Order: No       Mobility Bed Mobility Overal bed mobility: Needs Assistance Bed Mobility: Supine to Sit, Sit to Supine     Supine to sit: Total assist, HOB elevated Sit to supine: Mod assist        Transfers                   General transfer comment: not approrpiate to attempt on this date     Balance Overall balance assessment: Needs assistance Sitting-balance support: Feet supported, Bilateral upper extremity supported Sitting  balance-Leahy Scale: Poor   Postural control: Posterior lean                                 ADL either performed or assessed with clinical judgement   ADL Overall ADL's : Needs assistance/impaired Eating/Feeding: Maximal assistance;Sitting   Grooming: Maximal assistance;Total assistance           Upper Body Dressing : Maximal assistance   Lower Body Dressing: Maximal assistance;Total assistance       Toileting- Clothing Manipulation and Hygiene: Maximal assistance;Total assistance;Bed level              Extremity/Trunk Assessment              Vision       Perception     Praxis     Communication Communication Communication: Impaired Factors Affecting Communication: Hearing impaired   Cognition Arousal: Obtunded Behavior During Therapy: Flat affect Cognition: Difficult to assess, History of cognitive impairments, No family/caregiver present to determine baseline             OT - Cognition Comments: oriented to self only                 Following commands: Impaired Following commands impaired: Follows one step commands inconsistently      Cueing      Exercises Other Exercises Other Exercises: edu re role of OT, role of rehab    Shoulder Instructions  General Comments vital signs monitored, appear stable throughout    Pertinent Vitals/ Pain       Pain Assessment Pain Assessment: PAINAD Breathing: occasional labored breathing, short period of hyperventilation Negative Vocalization: occasional moan/groan, low speech, negative/disapproving quality Facial Expression: sad, frightened, frown Body Language: tense, distressed pacing, fidgeting Consolability: distracted or reassured by voice/touch PAINAD Score: 5 Pain Location: generalized Pain Intervention(s): Monitored during session, Repositioned  Home Living                                          Prior Functioning/Environment               Frequency  Min 1X/week        Progress Toward Goals  OT Goals(current goals can now be found in the care plan section)  Progress towards OT goals: Not progressing toward goals - comment;OT to reassess next treatment  Acute Rehab OT Goals Time For Goal Achievement: 09/13/24 ADL Goals Pt Will Perform Eating: with min assist Pt Will Perform Grooming: with min assist Pt Will Perform Lower Body Bathing: with max assist Pt Will Perform Upper Body Dressing: with mod assist Pt Will Perform Lower Body Dressing: with max assist Pt Will Transfer to Toilet: with mod assist  Plan      Co-evaluation                 AM-PAC OT 6 Clicks Daily Activity     Outcome Measure   Help from another person eating meals?: A Lot Help from another person taking care of personal grooming?: A Lot Help from another person toileting, which includes using toliet, bedpan, or urinal?: Total Help from another person bathing (including washing, rinsing, drying)?: Total Help from another person to put on and taking off regular upper body clothing?: Total Help from another person to put on and taking off regular lower body clothing?: Total 6 Click Score: 8    End of Session    OT Visit Diagnosis: Unsteadiness on feet (R26.81);Muscle weakness (generalized) (M62.81);Other symptoms and signs involving cognitive function   Activity Tolerance Patient tolerated treatment well   Patient Left in bed;with call bell/phone within reach;with bed alarm set   Nurse Communication Mobility status (cognition)        Time: 9086-9065 OT Time Calculation (min): 21 min  Charges: OT General Charges $OT Visit: 1 Visit OT Treatments $Therapeutic Activity: 8-22 mins  Therisa Sheffield, OTD OTR/L  09/01/24, 1:04 PM

## 2024-09-01 NOTE — Consult Note (Signed)
 Cardiology Consultation   Patient ID: Adrian Michael MRN: [id number]; DOB: 02-12-1935  Admit date: 08/29/2024 Date of Consult: 09/01/2024  PCP:  Godfrey Area, MD   [facility] Providers Cardiologist: new to [facility] Cardiology Physician requesting consult Dr. Caleen Reason for consult: Tachycardia  Patient Profile: Adrian Michael is a 89 y.o. male with a hx of advanced dementia, anorexia, chronic kidney disease, presenting with shortness of breath, weakness, influenza, acute onset tachycardia this evening  History of Present Illness: Adrian Michael is minimally communicative, most of the details obtained from the notes Notes indicating he is here visiting from [place] staying with daughter, diagnosed with influenza last week, started having fevers end of last week, receiving Tylenol  at home, secondary to worsening respiratory distress he presented to the emergency room Notes indicating he stopped eating a month ago, started drinking boost 3 to 4-day Speech is garbled, there was concern that he may have had a stroke In the ER placed on 6 L nasal cannula Creatinine 2.58, hemoglobin 9.8 which has drifted down to 7 Initial EKG showing normal sinus rhythm with PACs (read incorrectly as atrial fibrillation)  He has received IV fluid boluses for dehydration, acute on chronic renal failure Slow improvement in creatinine down to 2.0   History reviewed.  Past medical history Advanced dementia Chronic kidney disease Anemia Anorexia Gait instability  History reviewed. No pertinent surgical history.   Home Medications:  Prior to Admission medications   Medication Sig Start Date End Date Taking? Authorizing Provider  allopurinol  (ZYLOPRIM ) 100 MG tablet Take 100 mg by mouth daily.   Yes [provider]  atenolol (TENORMIN) 25 MG tablet Take 25 mg by mouth daily. 03/29/24  Yes [provider]  Cholecalciferol (VITAMIN D-3) 125 MCG (5000 UT) TABS Take by mouth.   Yes  [provider]  donepezil  (ARICEPT ) 10 MG tablet Take 10 mg by mouth daily. 08/24/24  Yes [provider]  Ginkgo Biloba 40 MG TABS Take by mouth.   Yes [provider]  levothyroxine  (SYNTHROID ) 50 MCG tablet Take 50 mcg by mouth daily.   Yes [provider]  losartan (COZAAR) 100 MG tablet Take 100 mg by mouth daily. 04/16/24  Yes [provider]  memantine  (NAMENDA ) 5 MG tablet Take by mouth.   Yes [provider]  metFORMIN (GLUCOPHAGE-XR) 750 MG 24 hr tablet Take 750 mg by mouth every evening. With dinner 03/14/21  Yes [provider]  mirtazapine  (REMERON ) 7.5 MG tablet Take 7.5 mg by mouth at bedtime. 08/24/24  Yes [provider]  Misc Natural Products (OSTEO BI-FLEX TRIPLE STRENGTH PO) Take by mouth.   Yes [provider]  Multiple Vitamins-Minerals (CENTRUM SILVER 50+MEN) TABS Take by mouth.   Yes [provider]  simvastatin  (ZOCOR ) 40 MG tablet Take 40 mg by mouth at bedtime. 08/24/24  Yes [provider]  tamsulosin  (FLOMAX ) 0.4 MG CAPS capsule Take 0.4 mg by mouth.   Yes [provider]  triamcinolone cream (KENALOG) 0.1 % Apply 1 application topically 2 (two) times daily.   Yes [provider]  amLODipine  (NORVASC ) 5 MG tablet Take 5 mg by mouth daily.    [provider]  CANNABIDIOL PO Take by mouth.    [provider]    Scheduled Meds:  (feeding supplement) PROSource Plus  30 mL Oral BID BM   sodium chloride    Intravenous Once   allopurinol   100 mg Oral Daily   amiodarone   150 mg Intravenous Once  vitamin C   500 mg Oral BID   donepezil   5 mg Oral QHS   Fe Fum-Vit C-Vit B12-FA  1 capsule Oral BID   feeding supplement  237 mL Oral TID BM   guaiFENesin   600 mg Oral BID   heparin  injection (subcutaneous)  5,000 Units Subcutaneous Q8H   [START ON 09/02/2024] levothyroxine   50 mcg Oral Daily   memantine   5 mg Oral Daily   mirtazapine   7.5 mg Oral  QHS   multivitamin with minerals  1 tablet Oral Daily   oseltamivir   30 mg Oral Daily   simvastatin   20 mg Oral QHS   tamsulosin   0.4 mg Oral QPC supper   zinc  sulfate (50mg  elemental zinc )  220 mg Oral Daily   Continuous Infusions:  amiodarone      Followed by   NOREEN ON 09/02/2024] amiodarone      azithromycin  500 mg (08/31/24 1931)   cefTRIAXone  (ROCEPHIN )  IV 200 mL/hr at 08/31/24 1852   dextrose  75 mL/hr at 09/01/24 1016   PRN Meds: acetaminophen  **OR** acetaminophen , chlorpheniramine-HYDROcodone, ipratropium-albuterol , magnesium  hydroxide, ondansetron  **OR** ondansetron  (ZOFRAN ) IV, traZODone   Allergies:   No Known Allergies  Social History:   Social History   Socioeconomic History   Marital status: Married    Spouse name: Not on file   Number of children: Not on file   Years of education: Not on file   Highest education level: Not on file  Occupational History   Not on file  Tobacco Use   Smoking status: Not on file   Smokeless tobacco: Not on file  Substance and Sexual Activity   Alcohol use: Not on file   Drug use: Not on file   Sexual activity: Not on file  Other Topics Concern   Not on file  Social History Narrative   Not on file   Social Drivers of Health   Financial Resource Strain: Not on file  Food Insecurity: No Food Insecurity (08/30/2024)   Hunger Vital Sign    Worried About Running Out of Food in the Last Year: Never true    Ran Out of Food in the Last Year: Never true  Transportation Needs: No Transportation Needs (08/30/2024)   PRAPARE - Administrator, Civil Service (Medical): No    Lack of Transportation (Non-Medical): No  Physical Activity: Not on file  Stress: Not on file  Social Connections: Patient Unable To Answer (08/30/2024)   Social Connection and Isolation Panel    Frequency of Communication with Friends and Family: Patient unable to answer    Frequency of Social Gatherings with Friends and Family: Patient unable to answer     Attends Religious Services: Patient unable to answer    Active Member of Clubs or Organizations: Patient unable to answer    Attends Banker Meetings: Patient unable to answer    Marital Status: Patient unable to answer  Intimate Partner Violence: Not At Risk (08/30/2024)   Humiliation, Afraid, Rape, and Kick questionnaire    Fear of Current or Ex-Partner: No    Emotionally Abused: No    Physically Abused: No    Sexually Abused: No    Family History:   History reviewed. No pertinent family history.   ROS:  Please see the history of present illness.  Review of Systems  Unable to perform ROS: Dementia   Physical Exam/Data: Vitals:   09/01/24 1210 09/01/24 1521 09/01/24 1628 09/01/24 1718  BP:  (!) 90/48 (!) 95/55 (!) 103/56  Pulse:  (!) 122 (!) 111 (!) 137  Resp:  (!) 21 (!) 24 (!) 25  Temp: 97.7 F (36.5 C) (!) 97.2 F (36.2 C) 97.9 F (36.6 C) 97.6 F (36.4 C)  TempSrc: Axillary  Axillary Axillary  SpO2:  100% 99% 98%  Weight:      Height:        Intake/Output Summary (Last 24 hours) at 09/01/2024 1815 Last data filed at 09/01/2024 1416 Gross per 24 hour  Intake 1242.89 ml  Output 2100 ml  Net -857.11 ml      08/29/2024    9:12 PM 08/29/2024    6:05 PM  Last 3 Weights  Weight (lbs) 134 lb 14.7 oz 135 lb  Weight (kg) 61.2 kg 61.236 kg     Body mass index is 21.13 kg/m.  On examination : Lethargic, minimally arousable, no JVD, lungs clear to auscultation bilaterally, heart sounds tachycardic with ectopy appreciated no murmurs appreciated, abdomen soft nontender no significant lower extremity edema.  Musculoskeletal exam with good range of motion, neurologic exam full exam not performed  EKG:  The EKG was personally reviewed and demonstrates: Normal sinus rhythm with PACs  Telemetry:  Telemetry was personally reviewed and demonstrates: Tachycardia rate 120s PVCs in a bigeminal pattern, unable to exclude atrial flutter versus atrial  tachycardia  Relevant CV Studies: Echo Left ventricular ejection fraction, by estimation, is 60 to 65%. The  left ventricle has normal function. The left ventricle has no regional  wall motion abnormalities. Left ventricular diastolic parameters were  normal. The global longitudinal strain  is normal.   2. Right ventricular systolic function is normal. The right ventricular  size is normal.   3. The mitral valve is normal in structure. Mild mitral valve  regurgitation. No evidence of mitral stenosis.   4. The aortic valve is normal in structure. Aortic valve regurgitation is  mild to moderate. Aortic valve sclerosis/calcification is present, without  any evidence of aortic stenosis.   5. The inferior vena cava is normal in size with greater than 50%  respiratory variability, suggesting right atrial pressure of 3 mmHg.   Laboratory Data: High Sensitivity Troponin:   Recent Labs  Lab 08/29/24 1801 08/29/24 2102 08/29/24 2157 08/29/24 2305  TROPONINIHS 322* 328* 331* 338*     Chemistry Recent Labs  Lab 08/30/24 0458 08/31/24 0532 09/01/24 0552  NA 141 143 148*  K 5.1 4.1 4.1  CL 114* 108 111  CO2 17* 25 27  GLUCOSE 133* 100* 89  BUN 118* 122* 126*  CREATININE 2.39* 2.18* 2.01*  CALCIUM  8.5* 8.1* 8.1*  MG  --  2.3  --   GFRNONAA 26* 29* 32*  ANIONGAP 10 10 10     Recent Labs  Lab 08/29/24 1801 08/30/24 0458 08/31/24 0532  PROT 6.4* 5.1* 4.6*  ALBUMIN 3.2* 2.5* 2.2*  AST 72* 69* 185*  ALT 69* 57* 111*  ALKPHOS 107 80 79  BILITOT 0.4 0.5 0.6   Lipids No results for input(s): CHOL, TRIG, HDL, LABVLDL, LDLCALC, CHOLHDL in the last 168 hours.  Hematology Recent Labs  Lab 08/30/24 0458 08/31/24 0532 09/01/24 0552  WBC 7.5 7.4 6.7  RBC 2.64* 2.15* 2.23*  HGB 8.8* 7.2* 7.2*  HCT 27.2* 21.6* 22.1*  MCV 103.0* 100.5* 99.1  MCH 33.3 33.5 32.3  MCHC 32.4 33.3 32.6  RDW 14.3 14.0 16.3*  PLT 211 194 171   Thyroid No results for input(s): TSH,  FREET4 in the last 168 hours.  BNP Recent  Labs  Lab 08/29/24 1801  BNP 413.4*    DDimer No results for input(s): DDIMER in the last 168 hours.  Radiology/Studies:  ECHOCARDIOGRAM COMPLETE Result Date: 08/30/2024    ECHOCARDIOGRAM REPORT   Patient Name:   RACIEL CAFFREY Date of Exam: 08/30/2024 Medical Rec #:  [id number]   Height:       67.0 in Accession #:    [id number]  Weight:       134.9 lb Date of Birth:  09/14/1935   BSA:          1.711 m Patient Age:    89 years    BP:           134/58 mmHg Patient Gender: M           HR:           73 bpm. Exam Location:  [facility] Procedure: 2D Echo, Cardiac Doppler, Color Doppler and Strain Analysis (Both            Spectral and Color Flow Doppler were utilized during procedure). Indications:     Elevated Troponin  History:         Patient has no prior history of Echocardiogram examinations.  Sonographer:     Thedora Louder RDCS, FASE Referring Phys:  [id number] MADISON A MANSY Diagnosing Phys: Denyse Bathe  Sonographer Comments: Global longitudinal strain was attempted. Challenging study due to patient's low acoustic windows. IMPRESSIONS  1. Left ventricular ejection fraction, by estimation, is 60 to 65%. The left ventricle has normal function. The left ventricle has no regional wall motion abnormalities. Left ventricular diastolic parameters were normal. The global longitudinal strain is normal.  2. Right ventricular systolic function is normal. The right ventricular size is normal.  3. The mitral valve is normal in structure. Mild mitral valve regurgitation. No evidence of mitral stenosis.  4. The aortic valve is normal in structure. Aortic valve regurgitation is mild to moderate. Aortic valve sclerosis/calcification is present, without any evidence of aortic stenosis.  5. The inferior vena cava is normal in size with greater than 50% respiratory variability, suggesting right atrial pressure of 3 mmHg. FINDINGS  Left Ventricle: Left ventricular ejection fraction, by  estimation, is 60 to 65%. The left ventricle has normal function. The left ventricle has no regional wall motion abnormalities. Strain was performed and the global longitudinal strain is normal. The  left ventricular internal cavity size was normal in size. There is no left ventricular hypertrophy. Left ventricular diastolic parameters were normal. Right Ventricle: The right ventricular size is normal. No increase in right ventricular wall thickness. Right ventricular systolic function is normal. Left Atrium: Left atrial size was normal in size. Right Atrium: Right atrial size was normal in size. Pericardium: There is no evidence of pericardial effusion. Mitral Valve: The mitral valve is normal in structure. Mild mitral valve regurgitation. No evidence of mitral valve stenosis. Tricuspid Valve: The tricuspid valve is normal in structure. Tricuspid valve regurgitation is trivial. No evidence of tricuspid stenosis. Aortic Valve: The aortic valve is normal in structure. Aortic valve regurgitation is mild to moderate. Aortic regurgitation PHT measures 810 msec. Aortic valve sclerosis/calcification is present, without any evidence of aortic stenosis. Aortic valve peak  gradient measures 5.7 mmHg. Pulmonic Valve: The pulmonic valve was normal in structure. Pulmonic valve regurgitation is not visualized. No evidence of pulmonic stenosis. Aorta: The aortic root is normal in size and structure. Venous: The inferior vena cava is normal in size with greater than 50% respiratory variability,  suggesting right atrial pressure of 3 mmHg. IAS/Shunts: No atrial level shunt detected by color flow Doppler. Additional Comments: 3D was performed not requiring image post processing on an independent workstation and was indeterminate.  LEFT VENTRICLE PLAX 2D LVIDd:         4.40 cm   Diastology LVIDs:         3.10 cm   LV e' medial:    6.85 cm/s LV PW:         1.30 cm   LV E/e' medial:  8.7 LV IVS:        1.20 cm   LV e' lateral:   6.74  cm/s LVOT diam:     2.40 cm   LV E/e' lateral: 8.9 LV SV:         95 LV SV Index:   55 LVOT Area:     4.52 cm  RIGHT VENTRICLE RV Basal diam:  3.70 cm RV S prime:     14.80 cm/s TAPSE (M-mode): 2.0 cm LEFT ATRIUM             Index        RIGHT ATRIUM           Index LA diam:        3.40 cm 1.99 cm/m   RA Area:     16.80 cm LA Vol (A2C):   36.7 ml 21.45 ml/m  RA Volume:   45.00 ml  26.30 ml/m LA Vol (A4C):   27.6 ml 16.13 ml/m LA Biplane Vol: 32.1 ml 18.76 ml/m  AORTIC VALVE                 PULMONIC VALVE AV Area (Vmax): 3.34 cm     PV Vmax:          1.15 m/s AV Vmax:        119.00 cm/s  PV Peak grad:     5.3 mmHg AV Peak Grad:   5.7 mmHg     PR End Diast Vel: 5.57 msec LVOT Vmax:      87.80 cm/s   RVOT Peak grad:   3 mmHg LVOT Vmean:     61.200 cm/s LVOT VTI:       0.209 m AI PHT:         810 msec  AORTA Ao Root diam: 3.70 cm MITRAL VALVE               TRICUSPID VALVE MV Area (PHT): 3.08 cm    TR Peak grad:   30.2 mmHg MV Decel Time: 246 msec    TR Vmax:        275.00 cm/s MV E velocity: 59.70 cm/s MV A velocity: 59.70 cm/s  SHUNTS MV E/A ratio:  1.00        Systemic VTI:  0.21 m                            Systemic Diam: 2.40 cm [employer] Electronically signed by Denyse Bathe Signature Date/Time: 08/30/2024/10:59:50 AM    Final    CT Head Wo Contrast Result Date: 08/29/2024 CLINICAL DATA:  Mental status change, unknown cause EXAM: CT HEAD WITHOUT CONTRAST TECHNIQUE: Contiguous axial images were obtained from the base of the skull through the vertex without intravenous contrast. RADIATION DOSE REDUCTION: This exam was performed according to the departmental dose-optimization program which includes automated exposure control, adjustment of the mA and/or kV according to patient size  and/or use of iterative reconstruction technique. COMPARISON:  None Available. FINDINGS: Brain: There is periventricular white matter decreased attenuation consistent with small vessel ischemic changes. Ventricles, sulci and  cisterns are prominent consistent with age related involutional changes. No acute intracranial hemorrhage, mass effect or shift. No hydrocephalus. Vascular: No hyperdense vessel or unexpected calcification. Skull: Normal. Negative for fracture or focal lesion. Sinuses/Orbits: No acute finding. IMPRESSION: Atrophy and chronic small vessel ischemic changes. No acute intracranial process identified. Electronically Signed   By: Fonda Field M.D.   On: 08/29/2024 18:39   DG Chest Port 1 View Result Date: 08/29/2024 CLINICAL DATA:  Shortness of breath EXAM: PORTABLE CHEST 1 VIEW COMPARISON:  None Available. FINDINGS: There are mild patchy airspace opacities in both lower lobes. There is no pleural effusion or pneumothorax. Cardiomediastinal silhouette demonstrates mild cardiomegaly. There is no pneumothorax or acute fracture. IMPRESSION: Mild patchy airspace opacities in both lower lobes, concerning for pneumonia. Follow-up PA and lateral chest x-ray recommended in 4-6 weeks to confirm resolution. Electronically Signed   By: Greig Pique M.D.   On: 08/29/2024 18:28     Assessment and Plan: Paroxysmal tachycardia Acute onset this evening appears to have PVCs in a bigeminal pattern, unable to exclude atrial tachycardia versus atrial flutter -Tachycardia associated with hypotension -Low blood pressure limiting treatment options, unable to use beta-blocker or calcium  channel blockers - Recommend we use amiodarone  IV with slow bolus 150 mg over 30 to 40 minutes (given low blood pressure) followed by infusion in effort to restore normal sinus rhythm - Respiratory status appears stable, no urgent need for BiPAP  Anemia In the setting of chronic disease Malnutrition, iron  deficiency, renal disease -Consider additional transfusion  Influenza pna Respiratory panel positive influenza A On broad-spectrum antibiotics ceftriaxone , Zithromax  Also on Tamiflu   Acute hypoxic respiratory failure In the setting  of influenza pneumonia Significant improvement in oxygenation since admission, initially requiring 15 L of oxygen now down to 3 L  Acute on chronic renal failure CKD stage IV Creatinine 2.5 improving down to 2.0 with IV fluids Nephrology following  Elevated troponin 3-400 range, likely demand ischemia in the setting of influenza pneumonia In the setting of influenza Normal ejection fraction on echo  For questions or updates, please contact [facility] Please consult [email or web address] for contact info under    Signed, Danity Schmelzer, MD  09/01/2024 6:15 PM

## 2024-09-01 NOTE — Consult Note (Addendum)
 Consultation Note Date: 09/01/2024 at 1445  Patient Name: Adrian Michael  DOB: [birth year]  MRN: [id number]  Age / Sex: 89 y.o., male  PCP: Adrian Area, MD Referring Physician: Caleen Qualia, MD  HPI/Patient Profile: 89 y.o. male  with past medical history of hypertension, mixed Alzheimer's and vascular dementia, GERD, hypothyroidism, type 2 diabetes, CKD (3B), gout, dyslipidemia, BPH, and hyperparathyroidism admitted on 08/29/2024 with generalized weakness, fever, poor appetite, productive cough, and shortness of breath.  Upon arrival to ED, patient's oxygen saturation was in the 80s and patient was placed on supplemental oxygen via nonrebreather mask.  Of note, patient tested positive for influenza on Wednesday, 08/25/2024.  As per chart review, patient was a resident in [place] for was malnourished and recently admitted to ICU.  Discharge, patient was sent to a rehab facility where he spent several weeks.  His daughter/HCPOA when he then decided to bring him to [place] -6 days prior to admission.  Radiograph of chest revealed mild patchy airspace opacities in both lower lobes-concerning for pneumonia.  Patient is being treated for influenzal pneumonia, acute hypoxic respiratory failure, elevated troponins, hypernatremia, anemia, elevated BNP, and protein calorie malnutrition.  PMT was consulted given patient's failure to thrive and to support patient and family with goals of care discussions. .   Clinical Assessment and Goals of Care: Extensive chart review completed prior to meeting patient including labs, vital signs, imaging, progress notes, orders, and available advanced directive documents from current and previous encounters. I then met with patient at bedside  to discuss diagnosis prognosis, GOC, EOL wishes, disposition and options.  Patient is asleep but easily awakens to my presence.  He  makes eye contact and says hello.  However, he quickly returns back to sleep.  When asked if he was in pain or uncomfortable, patient would turn his head but would not make any vocalizations.  Noticed a [place] veteran high on his bed.  I thanked him for his service yet this did not arouse the patient or have him engage with me further.  Despite multiple attempts to have patient engage in discussions with me, patient remains unable to participate in goals of care medical decision making independently at this time.  No family or friends present during my visit.  I attempted to discuss current medical situation patient highlighted that he is being hospitalized to support his respiratory system given that he has a wound.  Patient was not engaged or interactive with me at all.  Nonverbal signs of distress such as brow furrowing, wincing, grimacing, and fidgeting not noted.  No adjustment to [facility] needed at this time.  After visiting with the patient, I attempted to speak with his daughter Adrian Michael over the phone.  No answer.  Voicemail was full and I was unable to leave a message.  PMT will continue to follow and support patient.  I will attempt to speak with patient's family at a later date/time.  No change to plan of care at this time.  Addendum: Received return phone call from  daughter when he does.  Medical update given.  Education provided on influenzal PNA, anemia, respiratory failuire. Highlighted that patient's dementia is also a contributed factor to his recovery. Reviewed dementia as a chronic, irreversible, progressive disease that is often exacerbated by acute illnesses and hospitalizations.   Adrian Michael shares she is a nurse herself and understands the severity of PNA for her father. I attempted to elicit goals and values important to the patient. Adrian Michael shares patient has always deferred final answers about his health to her since she is in the medical field.   Discussed balancing remaining  hopeful but also realistic.  Discussed significance of patient's overall functional, nutritional, and cognitive status as significant indicators of this prognosis.  Adrian Michael speaks of patient having a significant decline over the last year as far as not being able or wanting to get out of bed, and taking less by mouth.  Adrian Michael shares her ultimate goal is that patient recovers from influenza and pneumonia and is able to transfer to [facility] memory care unit.  TOC following closely for discharge planning.  After review of advance directives on file, I discussed patient's wishes with Adrian Michael.  She shares that she wants patient to remain DNR with full interventions at this time.  She would never want him to have CPR but would be accepting of ventilatory support if needed to help him recover from this acute illness.  However, she is considering making him a full DNR once he recovers.  No adjustment to plan of care or MAR at this time.  Watchful waiting.  PMT will continue to follow.  PMT contact info given to Adrian Michael and advised to reach out with any acute palliative needs.  I plan to follow-up with patient and family again tomorrow.  Primary Decision Maker NEXT OF KIN  Physical Exam Vitals reviewed.  Constitutional:      Comments: Thin, frail  HENT:     Head:     Comments: Temporal wasting    Mouth/Throat:     Mouth: Mucous membranes are moist.  Eyes:     Pupils: Pupils are equal, round, and reactive to light.  Pulmonary:     Effort: Pulmonary effort is normal.  Abdominal:     Palpations: Abdomen is soft.  Skin:    General: Skin is warm and dry.     Coloration: Skin is pale.  Neurological:     Mental Status: He is alert.     Comments: UTA  Psychiatric:        Behavior: Behavior normal.     Palliative Assessment/Data: 30%     Thank you for this consult. Palliative medicine will continue to follow and assist holistically.   Time Total: 75 minutes  Time spent includes:  Detailed review of medical records (labs, imaging, vital signs), medically appropriate exam (mental status, respiratory, cardiac, skin), discussed with treatment team, counseling and educating patient, family and staff, documenting clinical information, medication management and coordination of care.  Signed by: Lamarr Gunner, DNP, FNP-BC Palliative Medicine   Please contact Palliative Medicine Team providers via [facility] for questions and concerns.

## 2024-09-01 NOTE — Progress Notes (Addendum)
 [facility]  ROUNDING NOTE   Subjective:   Patient laying in bed No acute distress noted Remains on room air No lower extremity edema  Creatinine 2.01 UOP  Objective:  Vital signs in last 24 hours:  Temp:  [97.2 F (36.2 C)-99.6 F (37.6 C)] 97.7 F (36.5 C) (09/03 1210) Pulse Rate:  [70-80] 78 (09/03 1149) Resp:  [15-21] 18 (09/03 1149) BP: (113-140)/(57-71) 118/68 (09/03 1149) SpO2:  [96 %-100 %] 100 % (09/03 1149)  Weight change:  Filed Weights   08/29/24 1805 08/29/24 2112  Weight: 61.2 kg 61.2 kg    Intake/Output: I/O last 3 completed shifts: In: 3651.6 [I.V.:2923.3; Blood:315; IV Piggyback:413.3] Out: 2850 [Urine:2850]   Intake/Output this shift:  No intake/output data recorded.  Physical Exam: General: NAD, chronically ill appearing, cachetic   Head: Normocephalic, atraumatic. Moist oral mucosal membranes  Eyes: Anicteric  Lungs:  Diminished (hindered by positioning)   Heart: Regular rate and rhythm  Abdomen:  Soft, nontender  Extremities:  No peripheral edema.  Neurologic: Awake, alert, conversant  Skin: Warm,dry, no rash       Basic Metabolic Panel: Recent Labs  Lab 08/29/24 1801 08/30/24 0458 08/31/24 0532 09/01/24 0552  NA 141 141 143 148*  K 5.7* 5.1 4.1 4.1  CL 117* 114* 108 111  CO2 13* 17* 25 27  GLUCOSE 252* 133* 100* 89  BUN 143* 118* 122* 126*  CREATININE 2.58* 2.39* 2.18* 2.01*  CALCIUM  8.5* 8.5* 8.1* 8.1*  MG  --   --  2.3  --     Liver Function Tests: Recent Labs  Lab 08/29/24 1801 08/30/24 0458 08/31/24 0532  AST 72* 69* 185*  ALT 69* 57* 111*  ALKPHOS 107 80 79  BILITOT 0.4 0.5 0.6  PROT 6.4* 5.1* 4.6*  ALBUMIN 3.2* 2.5* 2.2*   No results for input(s): LIPASE, AMYLASE in the last 168 hours. No results for input(s): AMMONIA in the last 168 hours.  CBC: Recent Labs  Lab 08/29/24 1801 08/30/24 0458 08/31/24 0532 09/01/24 0552  WBC 5.6 7.5 7.4 6.7  NEUTROABS 4.0  --   --   --    HGB 9.8* 8.8* 7.2* 7.2*  HCT 31.1* 27.2* 21.6* 22.1*  MCV 107.2* 103.0* 100.5* 99.1  PLT 220 211 194 171    Cardiac Enzymes: No results for input(s): CKTOTAL, CKMB, CKMBINDEX, TROPONINI in the last 168 hours.  BNP: Invalid input(s): POCBNP  CBG: No results for input(s): GLUCAP in the last 168 hours.  Microbiology: Results for orders placed or performed during the hospital encounter of 08/29/24  Culture, blood (routine x 2)     Status: None (Preliminary result)   Collection Time: 08/29/24  6:00 PM   Specimen: BLOOD  Result Value Ref Range Status   Specimen Description BLOOD BLOOD LEFT FOREARM  Final   Special Requests   Final    BOTTLES DRAWN AEROBIC AND ANAEROBIC Blood Culture results may not be optimal due to an inadequate volume of blood received in culture bottles   Culture   Final    NO GROWTH 2 DAYS Performed at [facility], [address] [place], [place] [address]    Report Status PENDING  Incomplete  Culture, blood (routine x 2)     Status: None (Preliminary result)   Collection Time: 08/29/24  6:00 PM   Specimen: BLOOD  Result Value Ref Range Status   Specimen Description BLOOD BLOOD RIGHT FOREARM  Final   Special Requests   Final  BOTTLES DRAWN AEROBIC AND ANAEROBIC Blood Culture results may not be optimal due to an inadequate volume of blood received in culture bottles   Culture   Final    NO GROWTH 2 DAYS Performed at [facility], [address] [place], [place] [address]    Report Status PENDING  Incomplete  Resp panel by RT-PCR (RSV, Flu A&B, Covid) Anterior Nasal Swab     Status: Abnormal   Collection Time: 08/29/24  6:01 PM   Specimen: Anterior Nasal Swab  Result Value Ref Range Status   SARS Coronavirus 2 by RT PCR NEGATIVE NEGATIVE Final    Comment: (NOTE) SARS-CoV-2 target nucleic acids are NOT DETECTED.  The SARS-CoV-2 RNA is generally detectable in upper respiratory specimens during the acute phase of  infection. The lowest concentration of SARS-CoV-2 viral copies this assay can detect is 138 copies/mL. A negative result does not preclude SARS-Cov-2 infection and should not be used as the sole basis for treatment or other patient management decisions. A negative result may occur with  improper specimen collection/handling, submission of specimen other than nasopharyngeal swab, presence of viral mutation(s) within the areas targeted by this assay, and inadequate number of viral copies(<138 copies/mL). A negative result must be combined with clinical observations, patient history, and epidemiological information. The expected result is Negative.  Fact Sheet for Patients:  BloggerCourse.com  Fact Sheet for Healthcare Providers:  SeriousBroker.it  This test is no t yet approved or cleared by the [place]  FDA and  has been authorized for detection and/or diagnosis of SARS-CoV-2 by FDA under an Emergency Use Authorization (EUA). This EUA will remain  in effect (meaning this test can be used) for the duration of the COVID-19 declaration under Section 564(b)(1) of the Act, 21 U.S.C.section 360bbb-3(b)(1), unless the authorization is terminated  or revoked sooner.       Influenza A by PCR POSITIVE (A) NEGATIVE Final   Influenza B by PCR NEGATIVE NEGATIVE Final    Comment: (NOTE) The Xpert Xpress SARS-CoV-2/FLU/RSV plus assay is intended as an aid in the diagnosis of influenza from Nasopharyngeal swab specimens and should not be used as a sole basis for treatment. Nasal washings and aspirates are unacceptable for Xpert Xpress SARS-CoV-2/FLU/RSV testing.  Fact Sheet for Patients: BloggerCourse.com  Fact Sheet for Healthcare Providers: SeriousBroker.it  This test is not yet approved or cleared by the [place]  FDA and has been authorized for detection and/or diagnosis of  SARS-CoV-2 by FDA under an Emergency Use Authorization (EUA). This EUA will remain in effect (meaning this test can be used) for the duration of the COVID-19 declaration under Section 564(b)(1) of the Act, 21 U.S.C. section 360bbb-3(b)(1), unless the authorization is terminated or revoked.     Resp Syncytial Virus by PCR NEGATIVE NEGATIVE Final    Comment: (NOTE) Fact Sheet for Patients: BloggerCourse.com  Fact Sheet for Healthcare Providers: SeriousBroker.it  This test is not yet approved or cleared by the [place]  FDA and has been authorized for detection and/or diagnosis of SARS-CoV-2 by FDA under an Emergency Use Authorization (EUA). This EUA will remain in effect (meaning this test can be used) for the duration of the COVID-19 declaration under Section 564(b)(1) of the Act, 21 U.S.C. section 360bbb-3(b)(1), unless the authorization is terminated or revoked.  Performed at [facility], [address], [place], [place] [address]     Coagulation Studies: Recent Labs    08/29/24 2156-09-08  LABPROT 14.8  INR 1.1    Urinalysis: Recent  Labs    08/29/24 2102  COLORURINE YELLOW*  LABSPEC 1.014  PHURINE 5.0  GLUCOSEU NEGATIVE  HGBUR NEGATIVE  BILIRUBINUR NEGATIVE  KETONESUR NEGATIVE  PROTEINUR 30*  NITRITE NEGATIVE  LEUKOCYTESUR NEGATIVE      Imaging: No results found.    Medications:    azithromycin  500 mg (08/31/24 1931)   cefTRIAXone  (ROCEPHIN )  IV 200 mL/hr at 08/31/24 1852   dextrose  75 mL/hr at 09/01/24 1016   lactated ringers  50 mL/hr at 08/31/24 1852    (feeding supplement) PROSource Plus  30 mL Oral BID BM   allopurinol   100 mg Oral Daily   amLODipine   5 mg Oral Daily   vitamin C   500 mg Oral BID   donepezil   5 mg Oral QHS   feeding supplement  237 mL Oral TID BM   guaiFENesin   600 mg Oral BID   heparin  injection (subcutaneous)  5,000 Units Subcutaneous Q8H   multivitamin with  minerals  1 tablet Oral Daily   oseltamivir   30 mg Oral Daily   tamsulosin   0.4 mg Oral QPC supper   zinc  sulfate (50mg  elemental zinc )  220 mg Oral Daily   acetaminophen  **OR** acetaminophen , chlorpheniramine-HYDROcodone, ipratropium-albuterol , magnesium  hydroxide, ondansetron  **OR** ondansetron  (ZOFRAN ) IV, traZODone   Assessment/ Plan:  Adrian Michael is a 89 y.o.  male with past medical history of a fib, vascular demential, diabetes, and hypertension, who was admitted to [facility] on 08/29/2024 for Influenzal pneumonia [J11.00] Influenza A [J10.1] Acute respiratory failure with hypoxia (HCC) [J96.01]   Acute kidney injury on chronic kidney disease stage IIIb. Most recent outpatient creatinine 1.80 with GFR 34 on 04/10/2021. Patient was lost to follow-up. Creatinine 2.58 on admission. Concerned that due to dementia and comorbidities, patient will make a poor dialysis candidate long-term.  Renal function has returned to baseline. Good urine output noted.   Lab Results  Component Value Date   CREATININE 2.01 (H) 09/01/2024   CREATININE 2.18 (H) 08/31/2024   CREATININE 2.39 (H) 08/30/2024    Intake/Output Summary (Last 24 hours) at 09/01/2024 1308 Last data filed at 09/01/2024 1100 Gross per 24 hour  Intake 2099.84 ml  Output 2250 ml  Net -150.16 ml   2.  Acute metabolic acidosis.  Serum bicarb 13 on admission.  Corrected   3.  Hypertension with chronic kidney disease.  Home regimen includes losartan and amlodipine .  Receiving amlodipine  only.   Due to renal recovery, we will sign off at this time.     LOS: 3 Adrian Michael 9/3/20251:08 PM

## 2024-09-01 NOTE — Assessment & Plan Note (Addendum)
 Frequent position change.

## 2024-09-01 NOTE — Progress Notes (Addendum)
 Initial Nutrition Assessment  DOCUMENTATION CODES:   Severe malnutrition in context of chronic illness  INTERVENTION:   -Downgrade diet to dysphagia 1 for ease of intake -MVI with minerals daily -Ensure Plus High Protein po TID, each supplement provides 350 kcal and 20 grams of protein  -Magic cup TID with meals, each supplement provides 290 kcal and 9 grams of protein  -500 mg vitamin C  BID -220 mg zinc  sulfate daily x 14 days -Feeding assistance with meals -RD will draw labs to assess for potential micronutrient deficiencies related to wound healing: vitamin A  NUTRITION DIAGNOSIS:   Severe Malnutrition related to chronic illness (dementia) as evidenced by moderate fat depletion, severe fat depletion, moderate muscle depletion, severe muscle depletion.  GOAL:   Patient will meet greater than or equal to 90% of their needs  MONITOR:   PO intake, Supplement acceptance  REASON FOR ASSESSMENT:   Malnutrition Screening Tool, Consult Assessment of nutrition requirement/status  ASSESSMENT:   Pt with medical history significant for hypertension, mixed Alzheimer's and vascular dementia, GERD, hypothyroidism, type II DM, CKD stage IIIb, gout, dyslipidemia, hyperparathyroidism, BPH, who presented to the hospital with generalized weakness, fever, poor appetite, productive cough and worsening shortness of breath  Pt admitted with influenza A pneumonia and possible secondary bacterial pneumonia.   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 Family is requesting enluxtra to be applied to wound, however, this is not available ton hospital formulary. This can be substituted with silver nitrate or family can bring supplies from home and nursing staff can assist with application.   Per chart review, pt is a DNR. He has history of malnutrition and was recently living in  [place]  prior to hospitalization and rehab placement. Pt has been in [place] for approximately 6 days PTA. Daughter is working on transitioning pt to memory care facility.   Pt currently on a dysphagia 3 diet with thin liquids. Noted poor oral intake. Meal completion 0-10%. Daughter left note with home medications and regimen in paper chart. Pt is on a puree diet at home and is unable to feed himself. He also consumes Boost Very High Calorie 3-4 times per day PTA.   Pt lying in bed at time of visit. No family at bedside. Pt open eyes briefly when spoken to, however, unable to arouse enough to engage in conversation.   No wt history available to assess wt history at this time.   Given pt's advanced age and dementia, would be hesitant to recommend artifical means of nutrition/ hydration.   Medications reviewed.   Labs reviewed: Na: 148.    NUTRITION - FOCUSED PHYSICAL EXAM:  Flowsheet Row Most Recent Value  Orbital Region Moderate depletion  Upper Arm Region Severe depletion  Thoracic and Lumbar Region Severe depletion  Buccal Region Moderate depletion  Temple Region Moderate depletion  Clavicle Bone Region Severe depletion  Clavicle and Acromion Bone Region Severe depletion  Scapular Bone Region Severe depletion  Dorsal Hand Moderate depletion  Patellar Region Severe depletion  Anterior Thigh Region Severe depletion  Posterior Calf Region Severe depletion  Edema (RD Assessment) None  Hair Reviewed  Eyes Reviewed  Mouth Reviewed  Skin Reviewed  Nails Reviewed    Diet Order:   Diet Order             DIET - DYS 1 Fluid consistency:  Thin  Diet effective now                   EDUCATION NEEDS:   Not appropriate for education at this time  Skin:  Skin Assessment: Skin Integrity Issues: Skin Integrity Issues:: Stage III Stage III: sacrum  Last BM:  Unknown  Height:   Ht Readings from Last 1 Encounters:  08/29/24 5' 7 (1.702 m)    Weight:   Wt Readings from  Last 1 Encounters:  08/29/24 61.2 kg    Ideal Body Weight:  67.3 kg  BMI:  Body mass index is 21.13 kg/m.  Estimated Nutritional Needs:   Kcal:  1800-2000  Protein:  100-115 grams  Fluid:  1.8-2.0 L    Margery ORN, RD, LDN, CDCES Registered Dietitian III Certified Diabetes Care and Education Specialist If unable to reach this RD, please use RD Inpatient group chat on secure chat between hours of 8am-4 pm daily

## 2024-09-01 NOTE — Progress Notes (Signed)
 Patient developed bigeminy with tachycardia, difficult to get underlying rhythm so cardiology was consulted. Blood pressure was becoming soft, s/p 500 cc of bolus with some improvement.  Heart rate remained mostly in 140s.  Patient seems more lethargic, amiodarone  bolus followed by infusion was ordered after discussing with cardiology. Also ordered 1 unit of PRBC as ideal hemoglobin should be above 8 per cardiology. Patient is being transferred to stepdown, need to use pressors if needed as he already received quite a bit of fluid and developing some crackles.  On exam patient appears more lethargic, tachycardic, barely opening his eyes.  Case was discussed with cardiology and PCCM.

## 2024-09-01 NOTE — Assessment & Plan Note (Signed)
 Hemoglobin of 7.2 s/p 1 unit of PRBC.  No obvious bleeding.  Anemia panel with anemia of chronic disease and some iron  deficiency.  B12 was normal and folate was not done. - Starting on supplement -Giving some Aranesp  -Check folate level -Monitor hemoglobin

## 2024-09-01 NOTE — Consult Note (Signed)
 WOC Nurse Consult Note: Reason for Consult: family wants Enluxtra wound care  Wound type: Stage 3 Pressure Injury sacrum  Pressure Injury POA: Yes Measurement: see nursing flowsheet  Wound bed: appears clean red  Drainage (amount, consistency, odor) serosanguinous on old dressing  Periwound:intact  Dressing procedure/placement/frequency: Cleanse sacral wound with NS, using a Q tip applicator insert a small piece of silver hydrofiber [employer] #[id number] [employer]) into wound bed daily, cover with silicone foam.   [place] DOES NOT CARRY ENLUXTRA ON FORMULARY.  If family is adamant that they want this item they can bring in from home and staff may apply to wound.  Silver is an appropriate substitute. This wound is clean and does not need debridement.  Silver is antimicrobial and absorbs drainage.    POC discussed with bedside nurse and primary MD.  WOC team will not follow. Re-consult if further needs arise.   Thank you,    Powell Bar MSN, RN-BC, [employer]

## 2024-09-01 NOTE — Assessment & Plan Note (Signed)
 Developed hypernatremia with free water  deficit of about 1.5 and sodium of 148 today. -Giving some D5 -Monitor sodium Encourage hydration-

## 2024-09-01 NOTE — Hospital Course (Addendum)
 Taken from prior notes.  Adrian Michael is a 89 y.o. male with medical history significant for hypertension, mixed Alzheimer's and vascular dementia, GERD, hypothyroidism, type II DM, CKD stage IIIb, gout, dyslipidemia, hyperparathyroidism, BPH, who presented to the hospital with generalized weakness, fever, poor appetite, productive cough and worsening shortness of breath.  Oxygen saturation was down into the 80s with EMS so he was placed on oxygen via nonrebreather mask and transported to the ED.  There is also report that he had tested positive for influenza on Wednesday, 08/25/2024.      Reportedly, patient had been living in [place] but was malnourished and had been recently admitted to the ICU.  He was discharged to a rehab facility where he spent several weeks.  His daughter decided to bring him to [place]  (about 6 days prior to admission) so that she could take care of him.   Chest x-ray showed mild patchy airspace opacities in both lower lobes, concerning for pneumonia.  9/3: Hemodynamically stable, on 3 L of oxygen.  PT is recommending SNF.  Improving renal function.  Mild hypernatremia today with sodium of 148 and water  deficit of 1.7L Started on D5. Lengthy discussion with daughter, apparently patient is declining, he was becoming mostly bedbound since November 2024, few hospitalizations due to concern of severe dehydration and hypotension.  Refusing most of the p.o. intake and losing weight, since April 2025 he is spending most of his time in bed and sleeping.  Daughter wants him to go to [place] [facility] memory care unit and is communicating with them.  TOC is aware Consulting palliative and dietitian.  Addendum.  Patient developed bigeminy with tachycardia, difficult to get underlying rhythm so cardiology was consulted. Blood pressure was becoming soft, s/p 500 cc of bolus with some improvement.  Heart rate remained mostly in 140s.  Patient seems more lethargic, amiodarone  bolus followed by  infusion was ordered after discussing with cardiology. Also ordered 1 unit of PRBC as ideal hemoglobin should be above 8 per cardiology. Patient is being transferred to stepdown, need to use pressors if needed as he already received quite a bit of fluid and developing some crackles.  9/4: Vital stable and converted back to sinus rhythm when seen today.  Little more alert.  Hemoglobin remained at 7.2 despite getting 2 unit of PRBC.  No worsening leukocytosis but procalcitonin at 4.02 and MRSA PCR positive-adding some vancomycin .  Cardiology continuing amiodarone  infusion.   9/5: Vital stable, hemoglobin with further worsening at 5, no obvious bleeding.  Smear with mixed RBC population and burr cells.  Checking Pavo virus, LDH normal,  CMP with some improvement in transaminitis but seems stable, T. bili normal.  Hematology was also consulted.  Checking FOBT and CT abdomen to rule out any occult bleeding.  9/6: Vital stable, on room air, hemoglobin at 6.8 after getting 2 more unit of PRBC yesterday.  Oncology ordered multiple myeloma panel, parvovirus labs are pending, likely bone marrow suppression secondary to viral infection.  Ordered 1 more unit of PRBC.  Amiodarone  infusion is being switched with p.o.  Completed the course of Zithromax  and ceftriaxone , will complete total of 5 days of vancomycin .  9/7: Vital stable, hemoglobin at 7.  Refusing most of the p.o. intake.  Remained in NSR  9/8: Vitals stable, little more lethargic earlier in the morning, hemoglobin at 6.8 and sodium of 152.  Patient with significant failure to thrive and not taking much p.o.  Palliative care was consulted and had a lengthy  discussion with daughter at bedside, she decided to proceed with full comfort care.  Patient was having significant uncontrolled back pain which seems chronic, becoming restless with pain, Dilaudid  as needed was helping.  Hospice was consulted and patient qualifies going to [facility] for end-of-life care.   Patient is being transferred to hospice home.

## 2024-09-01 NOTE — TOC Initial Note (Addendum)
 Transition of Care [facility]) - Initial/Assessment Note    Patient Details  Name: Adrian Michael MRN: [id number] Date of Birth: 05-02-1935  Transition of Care [facility]) CM/SW Contact:    Lauraine JAYSON Carpen, LCSW Phone Number: 09/01/2024, 12:41 PM  Clinical Narrative:  Patient only oriented to self. No family at bedside when CSW went by the room earlier today. CSW called daughter, introduced role, and explained that therapy recommendations would be discussed. Patient recently discharged from a SNF in the [place] area after about 4 weeks of rehab. Daughter brought him home with her and hired a caregiver who was with him 12 hours per day. Discussed how he is doing with therapy now. She feels like he is close to his baseline. Daughter has been in contact with [facility] liaison about potential admission there. They had told her that his wound had to be a stage 2 for them to be able to accept him. CSW left voicemail for [employer], [place]. No further concerns. CSW will continue to follow patient and his daughter for support and facilitate discharge once medically stable.                 2:18 pm: Received call back from [facility] staff. They will review the chart to determine if they can meet his needs.  Expected Discharge Plan:  (TBD) Barriers to Discharge: Continued Medical Work up   Patient Goals and CMS Choice            Expected Discharge Plan and Services     Post Acute Care Choice:  (TBD) Living arrangements for the past 2 months: Single Family Home                                      Prior Living Arrangements/Services Living arrangements for the past 2 months: Single Family Home Lives with:: Adult Children Patient language and need for interpreter reviewed:: Yes        Need for Family Participation in Patient Care: Yes (Comment) Care giver support system in place?: Yes (comment)   Criminal Activity/Legal Involvement Pertinent to Current  Situation/Hospitalization: No - Comment as needed  Activities of Daily Living   ADL Screening (condition at time of admission) Independently performs ADLs?: No Does the patient have a NEW difficulty with bathing/dressing/toileting/self-feeding that is expected to last >3 days?: No Does the patient have a NEW difficulty with getting in/out of bed, walking, or climbing stairs that is expected to last >3 days?: No Does the patient have a NEW difficulty with communication that is expected to last >3 days?: No Is the patient deaf or have difficulty hearing?: Yes Does the patient have difficulty seeing, even when wearing glasses/contacts?: No Does the patient have difficulty concentrating, remembering, or making decisions?: Yes  Permission Sought/Granted Permission sought to share information with : Facility Medical sales representative, Family Supports    Share Information with NAME: [name] Hughes  Permission granted to share info w AGENCY: [facility]  Permission granted to share info w Relationship: Daughter  Permission granted to share info w Contact Information: [phone]  Emotional Assessment Appearance:: Appears stated age Attitude/Demeanor/Rapport: Unable to Assess Affect (typically observed): Unable to Assess Orientation: : Oriented to Self Alcohol / Substance Use: Not Applicable Psych Involvement: No (comment)  Admission diagnosis:  Influenzal pneumonia [J11.00] Influenza A [J10.1] Acute respiratory failure with hypoxia (HCC) [J96.01] Patient Active Problem List  Diagnosis Date Noted   Influenzal pneumonia 08/29/2024   Acute kidney injury superimposed on chronic kidney disease (HCC) 08/29/2024   Elevated troponin I level 08/29/2024   Essential hypertension 08/29/2024   Gout 08/29/2024   BPH (benign prostatic hyperplasia) 08/29/2024   Mixed Alzheimer's and vascular dementia (HCC) 08/29/2024   Elevated brain natriuretic peptide (BNP) level 08/29/2024   PCP:   Godfrey Area, MD Pharmacy:  No Pharmacies Listed    Social Drivers of Health (SDOH) Social History: SDOH Screenings   Food Insecurity: No Food Insecurity (08/30/2024)  Housing: Low Risk  (08/30/2024)  Transportation Needs: No Transportation Needs (08/30/2024)  Utilities: Not At Risk (08/30/2024)  Social Connections: Patient Unable To Answer (08/30/2024)  Tobacco Use: Low Risk  (06/05/2021)   Received from [facility]   SDOH Interventions:     Readmission Risk Interventions     No data to display

## 2024-09-01 NOTE — Assessment & Plan Note (Signed)
 Estimated body mass index is 21.13 kg/m as calculated from the following:   Height as of this encounter: 5' 7 (1.702 m).   Weight as of this encounter: 61.2 kg.   - Dietitian consult

## 2024-09-01 NOTE — Assessment & Plan Note (Signed)
 Likely secondary to influenza pneumonia, no baseline oxygen use.  Initially required up to 15 L of oxygen, currently on 3 L. - Continue supplemental oxygen-wean as tolerated

## 2024-09-01 NOTE — Progress Notes (Signed)
 Patient pressure dropped, highest reading in the 90s systolic BP. Patient sleeping at the time. Heart rhythm converted to afib in 100s-130s. Notified Dr. Caleen, new orders for LR bolus.

## 2024-09-01 NOTE — Progress Notes (Signed)
 Physical Therapy Treatment Patient Details Name: Adrian Michael MRN: [id number] DOB: Apr 04, 1935 Today's Date: 09/01/2024   History of Present Illness Pt is an 89 y.o. male presenting to hospital 08/29/24 with c/o respiratory distress (SOB and weakness).   Pt was living in [place] (recently admitted to ICU and discharged to rehab faclity) and came to [facility] about 6 days prior to admission.  Pt admitted with influenza PNA, AKI superimposed on CKD, elevated troponin level (likely d/t demand ischemia).  PMH includes advanced dementia, CKD, a-fib, htn, DM, gout.    PT Comments  Pt received reclined in bed sleeping. Difficult to arouse, very obtunded this date. Opens eyes briefly to name and HOB elevation. Nods yes to attempting PT treatment. Needs near constant multi modal stimulation to attempt to stay awake and engaged. Requires totalA+1 and bed features to sit EOB. Pt opens eyes while sitting ~20 sec. Heavy Posterior lean needing modA at torso to prevent posterior balance loss. Pt impulsive to return to supine. Resistive to  maintain sitting despite encouragement. MinA+1 provided at LE's to return to supine. TotalA+1 x2 efforts to elevate pt to [facility]. Repositioned with pillow at heels and L hip for position change/offloaded for skin integrity. D/c recs remain appropriate.    If plan is discharge home, recommend the following: Two people to help with walking and/or transfers;A lot of help with bathing/dressing/bathroom;Assistance with cooking/housework;Assist for transportation;Help with stairs or ramp for entrance   Can travel by private vehicle     No  Equipment Recommendations  Other (comment) (TBD)    Recommendations for Other Services       Precautions / Restrictions Precautions Precautions: Fall Recall of Precautions/Restrictions: Impaired Restrictions Weight Bearing Restrictions Per Provider Order: No     Mobility  Bed Mobility Overal bed mobility: Needs Assistance Bed Mobility: Supine to  Sit, Sit to Supine     Supine to sit: Total assist, HOB elevated Sit to supine: Min assist (at LE's)     Patient Response: Cooperative, Flat affect, Impulsive  Transfers                   General transfer comment: pt not willing to sit EOB long enough to attempt transfer. Impulsively returns to supine    Ambulation/Gait                   Stairs             Wheelchair Mobility     Tilt Bed Tilt Bed Patient Response: Cooperative, Flat affect, Impulsive  Modified Rankin (Stroke Patients Only)       Balance Overall balance assessment: Needs assistance Sitting-balance support: Feet supported, Bilateral upper extremity supported Sitting balance-Leahy Scale: Poor Sitting balance - Comments: heavy posterior lean Postural control: Posterior lean                                  Communication Communication Communication: Impaired Factors Affecting Communication: Hearing impaired  Cognition Arousal: Obtunded Behavior During Therapy: Flat affect                           PT - Cognition Comments: nods yes to participate. Maintains significant drowsiness during session. Following commands: Impaired Following commands impaired: Follows one step commands inconsistently    Cueing Cueing Techniques: Gestural cues, Tactile cues, Verbal cues  Exercises      General Comments  Pertinent Vitals/Pain Pain Assessment Pain Assessment: Faces Faces Pain Scale: No hurt    Home Living                          Prior Function            PT Goals (current goals can now be found in the care plan section) Acute Rehab PT Goals Patient Stated Goal: to improve functional status PT Goal Formulation: With family Time For Goal Achievement: 09/13/24 Potential to Achieve Goals: Fair Progress towards PT goals: Progressing toward goals    Frequency    Min 2X/week      PT Plan      Co-evaluation               AM-PAC PT 6 Clicks Mobility   Outcome Measure  Help needed turning from your back to your side while in a flat bed without using bedrails?: A Lot Help needed moving from lying on your back to sitting on the side of a flat bed without using bedrails?: A Lot Help needed moving to and from a bed to a chair (including a wheelchair)?: Total Help needed standing up from a chair using your arms (e.g., wheelchair or bedside chair)?: Total Help needed to walk in hospital room?: Total Help needed climbing 3-5 steps with a railing? : Total 6 Click Score: 8    End of Session   Activity Tolerance: Patient limited by fatigue;Patient limited by lethargy Patient left: in bed;with call bell/phone within reach;with bed alarm set (L hip offloaded for position change) Nurse Communication: Mobility status PT Visit Diagnosis: Other abnormalities of gait and mobility (R26.89);Muscle weakness (generalized) (M62.81)     Time: 8987-8974 PT Time Calculation (min) (ACUTE ONLY): 13 min  Charges:    $Therapeutic Activity: 8-22 mins PT General Charges $$ ACUTE PT VISIT: 1 Visit                     [name] Fairly IV, PT, DPT Physical Therapist- [place]  [facility] 09/01/2024, 11:29 AM

## 2024-09-02 ENCOUNTER — Encounter: Payer: Self-pay | Admitting: Family Medicine

## 2024-09-02 DIAGNOSIS — J9601 Acute respiratory failure with hypoxia: Secondary | ICD-10-CM | POA: Diagnosis not present

## 2024-09-02 DIAGNOSIS — G309 Alzheimer's disease, unspecified: Secondary | ICD-10-CM | POA: Diagnosis not present

## 2024-09-02 DIAGNOSIS — I480 Paroxysmal atrial fibrillation: Secondary | ICD-10-CM | POA: Diagnosis not present

## 2024-09-02 DIAGNOSIS — N179 Acute kidney failure, unspecified: Secondary | ICD-10-CM | POA: Diagnosis not present

## 2024-09-02 DIAGNOSIS — E43 Unspecified severe protein-calorie malnutrition: Secondary | ICD-10-CM | POA: Diagnosis not present

## 2024-09-02 DIAGNOSIS — J1100 Influenza due to unidentified influenza virus with unspecified type of pneumonia: Secondary | ICD-10-CM | POA: Diagnosis not present

## 2024-09-02 DIAGNOSIS — N184 Chronic kidney disease, stage 4 (severe): Secondary | ICD-10-CM | POA: Diagnosis not present

## 2024-09-02 DIAGNOSIS — I471 Supraventricular tachycardia, unspecified: Secondary | ICD-10-CM

## 2024-09-02 LAB — RENAL FUNCTION PANEL
Albumin: 2 g/dL — ABNORMAL LOW (ref 3.5–5.0)
Anion gap: 11 (ref 5–15)
BUN: 118 mg/dL — ABNORMAL HIGH (ref 8–23)
CO2: 26 mmol/L (ref 22–32)
Calcium: 8.2 mg/dL — ABNORMAL LOW (ref 8.9–10.3)
Chloride: 112 mmol/L — ABNORMAL HIGH (ref 98–111)
Creatinine, Ser: 2.03 mg/dL — ABNORMAL HIGH (ref 0.61–1.24)
GFR, Estimated: 31 mL/min — ABNORMAL LOW (ref >60)
Glucose, Bld: 118 mg/dL — ABNORMAL HIGH (ref 70–99)
Phosphorus: 4.2 mg/dL (ref 2.5–4.6)
Potassium: 3.9 mmol/L (ref 3.5–5.1)
Sodium: 149 mmol/L — ABNORMAL HIGH (ref 135–145)

## 2024-09-02 LAB — CBC
HCT: 22.2 % — ABNORMAL LOW (ref 39.0–52.0)
Hemoglobin: 7.2 g/dL — ABNORMAL LOW (ref 13.0–17.0)
MCH: 30.9 pg (ref 26.0–34.0)
MCHC: 32.4 g/dL (ref 30.0–36.0)
MCV: 95.3 fL (ref 80.0–100.0)
Platelets: 155 K/uL (ref 150–400)
RBC: 2.33 MIL/uL — ABNORMAL LOW (ref 4.22–5.81)
RDW: 19.9 % — ABNORMAL HIGH (ref 11.5–15.5)
WBC: 5.4 K/uL (ref 4.0–10.5)
nRBC: 0 % (ref 0.0–0.2)

## 2024-09-02 LAB — BLOOD GAS, VENOUS
Acid-Base Excess: 2.3 mmol/L — ABNORMAL HIGH (ref 0.0–2.0)
Bicarbonate: 26.5 mmol/L (ref 20.0–28.0)
O2 Saturation: 95.1 %
Patient temperature: 37
pCO2, Ven: 39 mmHg — ABNORMAL LOW (ref 44–60)
pH, Ven: 7.44 — ABNORMAL HIGH (ref 7.25–7.43)
pO2, Ven: 66 mmHg — ABNORMAL HIGH (ref 32–45)

## 2024-09-02 LAB — PROCALCITONIN: Procalcitonin: 4.07 ng/mL

## 2024-09-02 LAB — GLUCOSE, CAPILLARY: Glucose-Capillary: 112 mg/dL — ABNORMAL HIGH (ref 70–99)

## 2024-09-02 MED ORDER — IRON SUCROSE 300 MG IVPB - SIMPLE MED
300.0000 mg | Freq: Once | Status: AC
  Administered 2024-09-02: 300 mg via INTRAVENOUS
  Filled 2024-09-02: qty 300

## 2024-09-02 MED ORDER — GUAIFENESIN 100 MG/5ML PO LIQD
15.0000 mL | Freq: Four times a day (QID) | ORAL | Status: DC
  Administered 2024-09-02 – 2024-09-05 (×8): 15 mL via ORAL
  Filled 2024-09-02 (×10): qty 20

## 2024-09-02 MED ORDER — VANCOMYCIN HCL IN DEXTROSE 1-5 GM/200ML-% IV SOLN
1000.0000 mg | INTRAVENOUS | Status: DC
  Administered 2024-09-04: 1000 mg via INTRAVENOUS
  Filled 2024-09-02 (×2): qty 200

## 2024-09-02 MED ORDER — DEXTROSE 5 % IV SOLN: INTRAVENOUS | Status: AC

## 2024-09-02 MED ORDER — VANCOMYCIN HCL 1250 MG/250ML IV SOLN
1250.0000 mg | Freq: Once | INTRAVENOUS | Status: AC
  Administered 2024-09-02: 1250 mg via INTRAVENOUS
  Filled 2024-09-02: qty 250

## 2024-09-02 NOTE — Assessment & Plan Note (Signed)
 Blood pressure currently within goal. -Amlodipine  was held due to softer blood pressure earlier. -Keep holding losartan

## 2024-09-02 NOTE — Plan of Care (Signed)

## 2024-09-02 NOTE — Assessment & Plan Note (Addendum)
 Hemoglobin at 7 s/p 5 unit of PRBC, 1 dose of IV iron  and 1 dose of Aranesp .  Anemia panel with anemia of chronic disease and some iron  deficiency.  B12 and folate was normal . Cardiology would like to keep hemoglobin above 8 -Smear with mixed population of RBC and burr cells -T. bili normal, LDH normal, haptoglobin pending -Pending parvovirus and MM labs -CT abdomen was negative for any occult bleeding or hematoma --Hematology consulted -Likely a bone marrow suppression secondary to viral infection

## 2024-09-02 NOTE — Assessment & Plan Note (Addendum)
 Blood cultures remain negative and respiratory panel positive for influenza A. - Completed a 5-day course of ceftriaxone  and Zithromax   -Increasing procalcitonin so adding vancomycin  as MRSA PCR came back positive-will do for total of 5 days - Completed a course of Tamiflu 

## 2024-09-02 NOTE — Assessment & Plan Note (Addendum)
 Patient has an history of CKD stage IV. Renal functions seem stable with small variation around baseline, metabolic acidosis has been resolved.  Nephrology is on board.  -Monitor renal function -Avoid nephrotoxins

## 2024-09-02 NOTE — Assessment & Plan Note (Signed)
 Persistent hyponatremia with sodium of 149 today despite getting D5 -Continue with D5 for another day -Monitor sodium Encourage hydration-

## 2024-09-02 NOTE — Progress Notes (Addendum)
 The patient has been transferred to 2A room 236. Report has been called to Marinda FALCON, Charity fundraiser. The patients daughter has been informed of the transfer. The patient's daughter has been informed of the belonging that have been transferred with the patient and the belonging bags.

## 2024-09-02 NOTE — Progress Notes (Signed)
  Progress Note  Patient Name: Adrian Michael Date of Encounter: 09/02/2024 [facility] Cardiologist:New  Interval Summary    Lethargic and sleepy during interview. He has baseline dementia. Reports he is doing OK. Appears he converted to Afib then NSR overnight.  Vital Signs Vitals:   09/02/24 0400 09/02/24 0500 09/02/24 0600 09/02/24 0700  BP: (!) 119/54 (!) 119/52 (!) 131/57 119/63  Pulse: 72 79 67 81  Resp: (!) 21 (!) 22 (!) 22 17  Temp:      TempSrc:      SpO2: 100% 99% 98% 100%  Weight:      Height:        Intake/Output Summary (Last 24 hours) at 09/02/2024 0809 Last data filed at 09/02/2024 0600 Gross per 24 hour  Intake 2187.69 ml  Output 1750 ml  Net 437.69 ml      08/29/2024    9:12 PM 08/29/2024    6:05 PM  Last 3 Weights  Weight (lbs) 134 lb 14.7 oz 135 lb  Weight (kg) 61.2 kg 61.236 kg      Telemetry/ECG  SVT>Afib>NSR 70s - Personally Reviewed  Physical Exam  GEN: No acute distress.   Neck: No JVD Cardiac: RRR, no murmurs, rubs, or gallops.  Respiratory: wheezing GI: Soft, nontender, non-distended  MS: No edema  Relevant CV studies:  1. Left ventricular ejection fraction, by estimation, is 60 to 65%. The  left ventricle has normal function. The left ventricle has no regional  wall motion abnormalities. Left ventricular diastolic parameters were  normal. The global longitudinal strain  is normal.   2. Right ventricular systolic function is normal. The right ventricular  size is normal.   3. The mitral valve is normal in structure. Mild mitral valve  regurgitation. No evidence of mitral stenosis.   4. The aortic valve is normal in structure. Aortic valve regurgitation is  mild to moderate. Aortic valve sclerosis/calcification is present, without  any evidence of aortic stenosis.   5. The inferior vena cava is normal in size with greater than 50%  respiratory variability, suggesting right atrial pressure of 3 mmHg.    Patient  Profile: Adrian Michael is a 89 y.o. male with a hx of advanced dementia, anorexia, chronic kidney disease, presenting with shortness of breath, weakness, influenza, acute onset tachycardia  Assessment & Plan   SVT Afib - patient admitted for influenza/PNA, anemia found to have tachyarrhythmia. He has baseline dementia - 9/3 in the PM he was noted to have SVT started on IV amio with subsequent conversion to Afib then NSR early this AM - patient remaines in NST - given Afib less than 24 hours, anemia, age and other comorbidities patient is not a good candidate for a/c - continue to monitor tele - recent echo showed low EF - continue IV amiodarone  for now  Anemia - s/p transfusion - hgb 7.2 - per IM  Influenza/PNA - per CCM  AKI on CKD stage IV - Scr improving Scr 2.03    For questions or updates, please contact  HeartCare Please consult [email or web address] for contact info under       Signed, Ronak Duquette VEAR Fishman, PA-C

## 2024-09-02 NOTE — Assessment & Plan Note (Addendum)
 Patient developed sudden onset tachycardia with variable underlying rhythm, intermittent concern of SVT, bigeminy, A-fib/flutter. Cardiology was consulted and patient was given amiodarone  bolus followed by infusion. Converted back to sinus rhythm and remained in NSR - Cardiology is on board - Amiodarone  infusion is being switched with p.o. now

## 2024-09-02 NOTE — Progress Notes (Signed)
 PT Cancellation Note  Patient Details Name: Adrian Michael MRN: [id number] DOB: 1935/10/26   Cancelled Treatment:    Reason Eval/Treat Not Completed: Medical issues which prohibited therapy. Per EMR pt transferred to ICU. Due to need for higher level of care, PT will need new PT orders when medically appropriate to continue POC. Attending MD updated and aware. Will continue to follow as appropriate.   [name] Fairly IV, PT, DPT Physical Therapist- [place]  [facility] 09/02/2024, 11:00 AM

## 2024-09-02 NOTE — Plan of Care (Signed)
  Problem: Education: Goal: Knowledge of General Education information will improve Description: Including pain rating scale, medication(s)/side effects and non-pharmacologic comfort measures Outcome: Not Progressing   Problem: Health Behavior/Discharge Planning: Goal: Ability to manage health-related needs will improve Outcome: Not Progressing   Problem: Clinical Measurements: Goal: Ability to maintain clinical measurements within normal limits will improve Outcome: Progressing Goal: Diagnostic test results will improve Outcome: Progressing Goal: Respiratory complications will improve Outcome: Progressing   Problem: Activity: Goal: Risk for activity intolerance will decrease Outcome: Not Progressing

## 2024-09-02 NOTE — Progress Notes (Signed)
 Palliative Care Progress Note, Assessment & Plan   Patient Name: Adrian Michael       Date: 09/02/2024 DOB: [birth year]  Age: 89 y.o. MRN#: [id number] Attending Physician: Caleen Qualia, MD Primary Care Physician: Godfrey Area, MD Admit Date: 08/29/2024  Subjective: Patient is sitting up in bed getting a new IV from ICU RN at bedside.  No family or friends present as bedside during last visit.  HPI: 89 y.o. male  with past medical history of hypertension, mixed Alzheimer's and vascular dementia, GERD, hypothyroidism, type 2 diabetes, CKD (3B), gout, dyslipidemia, BPH, and hyperparathyroidism admitted on 08/29/2024 with generalized weakness, fever, poor appetite, productive cough, and shortness of breath.   Upon arrival to ED, patient's oxygen saturation was in the 80s and patient was placed on supplemental oxygen via nonrebreather mask.   Of note, patient tested positive for influenza on Wednesday, 08/25/2024.   As per chart review, patient was a resident in [place] for was malnourished and recently admitted to ICU.  Discharge, patient was sent to a rehab facility where he spent several weeks.  His daughter/HCPOA when he then decided to bring him to [place] -6 days prior to admission.   Radiograph of chest revealed mild patchy airspace opacities in both lower lobes-concerning for pneumonia.   Patient is being treated for influenzal pneumonia, acute hypoxic respiratory failure, elevated troponins, hypernatremia, anemia, elevated BNP, and protein calorie malnutrition.   PMT was consulted given patient's failure to thrive and to support patient and family with goals of care discussions.  Summary of counseling/coordination of care: Extensive chart review completed prior to meeting patient including  labs, vital signs, imaging, progress notes, orders, and available advanced directive documents from current and previous encounters.   After reviewing the patient's chart and assessing the patient at bedside, I spoke with patient in regards to symptom management and goals of care.   Symptoms assessed.  Patient made no verbalizations during my discussion.  However, he nodded his head confirming that he is in no pain or discomfort.  RN shares he had has had small bites p.o. intake when daughter is at bedside providing him.  Patient has no obvious distress or complaints at this time.  After visiting with patient, I spoke with Dr. Lynann over the phone.  Brief medical update given.  Discussed that patient has received blood yet hemoglobin has not trended upward, remaining at 7.2.  Additionally, discussed concern for low albumin.  She shares that his albumin lives in the lower thresholds.  She shares she is grateful for his progress.  She remains hopeful that he will continue to show signs of improvement.  I again advised her to remain hopeful but also realistic about his multiple comorbidities and advanced age.  She was appreciative of my phone call.  No adjustment to plan of care at this time.  PMT will continue to follow and support.  Physical Exam Vitals reviewed.  Constitutional:      Comments: Thin, frail  HENT:     Head:     Comments: Temporal wasting    Mouth/Throat:     Mouth: Mucous membranes are moist.  Eyes:     Pupils: Pupils are equal, round, and reactive  to light.  Pulmonary:     Effort: Pulmonary effort is normal.  Abdominal:     Palpations: Abdomen is soft.  Neurological:     Mental Status: He is alert.     Comments: Nonverbal, nods             Total Time 35 minutes   Time spent includes: Detailed review of medical records (labs, imaging, vital signs), medically appropriate exam (mental status, respiratory, cardiac, skin), discussed with treatment team, counseling and  educating patient, family and staff, documenting clinical information, medication management and coordination of care.  Lamarr L. Arvid, DNP, FNP-BC Palliative Medicine Team

## 2024-09-02 NOTE — Progress Notes (Signed)
 Progress Note   Patient: Adrian Michael FMW:[id number] DOB: 1935/09/01 DOA: 08/29/2024     4 DOS: the patient was seen and examined on 09/02/2024   Brief hospital course: Taken from prior notes.  Adrian Michael is a 89 y.o. male with medical history significant for hypertension, mixed Alzheimer's and vascular dementia, GERD, hypothyroidism, type II DM, CKD stage IIIb, gout, dyslipidemia, hyperparathyroidism, BPH, who presented to the hospital with generalized weakness, fever, poor appetite, productive cough and worsening shortness of breath.  Oxygen saturation was down into the 80s with EMS so he was placed on oxygen via nonrebreather mask and transported to the ED.  There is also report that he had tested positive for influenza on Wednesday, 08/25/2024.      Reportedly, patient had been living in [place] but was malnourished and had been recently admitted to the ICU.  He was discharged to a rehab facility where he spent several weeks.  His daughter decided to bring him to [place]  (about 6 days prior to admission) so that she could take care of him.   Chest x-ray showed mild patchy airspace opacities in both lower lobes, concerning for pneumonia.  9/3: Hemodynamically stable, on 3 L of oxygen.  PT is recommending SNF.  Improving renal function.  Mild hypernatremia today with sodium of 148 and water  deficit of 1.7L Started on D5. Lengthy discussion with daughter, apparently patient is declining, he was becoming mostly bedbound since November 2024, few hospitalizations due to concern of severe dehydration and hypotension.  Refusing most of the p.o. intake and losing weight, since April 2025 he is spending most of his time in bed and sleeping.  Daughter wants him to go to [facility] memory care unit and is communicating with them.  TOC is aware Consulting palliative and dietitian.  Addendum.  Patient developed bigeminy with tachycardia, difficult to get underlying rhythm so cardiology was  consulted. Blood pressure was becoming soft, s/p 500 cc of bolus with some improvement.  Heart rate remained mostly in 140s.  Patient seems more lethargic, amiodarone  bolus followed by infusion was ordered after discussing with cardiology. Also ordered 1 unit of PRBC as ideal hemoglobin should be above 8 per cardiology. Patient is being transferred to stepdown, need to use pressors if needed as he already received quite a bit of fluid and developing some crackles.  9/4: Vital stable and converted back to sinus rhythm when seen today.  Little more alert.  Hemoglobin remained at 7.2 despite getting 2 unit of PRBC.  No worsening leukocytosis but procalcitonin at 4.02 and MRSA PCR positive-adding some vancomycin .  Cardiology continuing amiodarone  infusion.  Assessment and Plan: * Influenzal pneumonia Blood cultures remain negative and respiratory panel positive for influenza A. - Continue ceftriaxone  and Zithromax  to complete a 5-day course for concern of superadded bacterial pneumonia -Increasing procalcitonin so adding vancomycin  as MRSA PCR came back positive - Continue with Tamiflu    Acute respiratory failure with hypoxia (HCC) Likely secondary to influenza pneumonia, no baseline oxygen use.  Initially required up to 15 L of oxygen, currently on 3 L. - Continue supplemental oxygen-wean as tolerated  Acute kidney injury superimposed on chronic kidney disease (HCC) Patient has an history of CKD stage IV. Improving renal function with creatinine at 2.03 today.  Seems to be around baseline now.  Metabolic acidosis has been resolved.  Nephrology is on board.  -Monitor renal function -Avoid nephrotoxins  Cardiac arrhythmia Patient developed sudden onset tachycardia with variable underlying rhythm, intermittent concern of SVT, bigeminy,  A-fib/flutter. Cardiology was consulted and patient was given amiodarone  bolus followed by infusion. Converted back to sinus rhythm after midnight. -  Cardiology is on board -Continue with amiodarone  infusion for now  Elevated troponin I level Likely demand ischemia.  Echocardiogram was normal. Cardiology was consulted-no need for any further investigation so cardiology signed off.  Essential hypertension Blood pressure currently within goal. -Amlodipine  was held due to softer blood pressure earlier. -Keep holding losartan  Hypernatremia Persistent hyponatremia with sodium of 149 today despite getting D5 -Continue with D5 for another day -Monitor sodium Encourage hydration-  Anemia Hemoglobin remained at 7.2 s/p 2 unit of PRBC no obvious bleeding.  Anemia panel with anemia of chronic disease and some iron  deficiency.  B12 and folate was normal . Cardiology would like to keep hemoglobin above 9 -Ordered 1 dose of IV iron  - Starting on supplement - Received Aranesp   -Monitor hemoglobin  Elevated brain natriuretic peptide (BNP) level BNP of 413 on admission, clinically appears dry.  Echocardiogram was normal.  Likely due to CKD and respiratory distress with influenza pneumonia. - Monitor volume status closely  Mixed Alzheimer's and vascular dementia (HCC) - Will continue Aricept  and Namenda  -Patient oriented to name at baseline  Protein-calorie malnutrition, severe Estimated body mass index is 21.13 kg/m as calculated from the following:   Height as of this encounter: 5' 7 (1.702 m).   Weight as of this encounter: 61.2 kg.   - Dietitian consult  Gout - Will continue allopurinol .  BPH (benign prostatic hyperplasia) - Will continue Flomax .  Pressure injury of skin - Frequent position change        Subjective: Patient was little more awake when seen today, minimally interactive, stating no to chest pain.  Physical Exam: Vitals:   09/02/24 1530 09/02/24 1545 09/02/24 1600 09/02/24 1615  BP:   132/71   Pulse: 86 92 91 87  Resp: (!) 27 (!) 28 (!) 26 (!) 28  Temp:   98.1 F (36.7 C)   TempSrc:   Oral   SpO2:  92% 93% 96% 95%  Weight:      Height:       General.  Frail and malnourished elderly man, in no acute distress. Pulmonary.  Lungs clear bilaterally, normal respiratory effort. CV.  Regular rate and rhythm,  Abdomen.  Soft, nontender, nondistended, BS positive. CNS.  Lethargic.  No apparent focal neurologic deficit. Extremities.  No edema, no cyanosis, pulses intact and symmetrical. Psychiatry.  Judgment and insight appears impaired.  Data Reviewed: Prior data reviewed  Family Communication: Discussed with daughter on phone  Disposition: Status is: Inpatient Remains inpatient appropriate because: Severity of illness  Planned Discharge Destination: Skilled nursing facility  DVT prophylaxis.  Subcu heparin  Time spent: 50 minutes  This record has been created using Conservation officer, historic buildings. Errors have been sought and corrected,but may not always be located. Such creation errors do not reflect on the standard of care.   Author: Amaryllis Dare, MD 09/02/2024 4:27 PM  For on call review [email or web address].

## 2024-09-02 NOTE — Consult Note (Signed)
 Pharmacy Antibiotic Note  Adrian Michael is a 89 y.o. male admitted on 08/29/2024 with pneumonia.  Patient is influenza A positive with post influenza pneumonia.  Pharmacy has been consulted for vancomycin  dosing.  Scr 2.58>2.39>2.18>2.03 (unsure of baseline)  Plan: Give vancomycin  1250 mg IV x 1, followed by 1000 mg IV every 48 hours Estimated AUC 497.3, Cmin 11.7 61.2 kg, Scr 2.03, Vd 0.72 Vancomycin  levels at steady state or as clinically indicated Patient also currently on ceftriaxone  2g IV every 24 hours and Azithromycin  500 mg IV every 24 hours Follow renal function and cultures for adjustments  Height: 5' 7 (170.2 cm) Weight: 61.2 kg (134 lb 14.7 oz) IBW/kg (Calculated) : 66.1  Temp (24hrs), Avg:98.1 F (36.7 C), Min:97.5 F (36.4 C), Max:99.2 F (37.3 C)  Recent Labs  Lab 08/29/24 1801 08/29/24 2102 08/30/24 0458 08/31/24 0532 09/01/24 0552 09/02/24 0214  WBC 5.6  --  7.5 7.4 6.7 5.4  CREATININE 2.58*  --  2.39* 2.18* 2.01* 2.03*  LATICACIDVEN 2.0* 1.9  --   --   --   --     Estimated Creatinine Clearance: 22.2 mL/min (A) (by C-G formula based on SCr of 2.03 mg/dL (H)).    No Known Allergies  Antimicrobials this admission: ceftriaxone  8/31 >> azithromycin  8/31 >>  Vancomycin  9/4>>  Dose adjustments this admission: N/A  Microbiology results: 8/31 BCx: ngtd 9/3 MRSA PCR: detected  Thank you for allowing pharmacy to be a part of this patient's care.  [name], PharmD 09/02/2024 3:44 PM

## 2024-09-03 ENCOUNTER — Inpatient Hospital Stay

## 2024-09-03 DIAGNOSIS — D649 Anemia, unspecified: Secondary | ICD-10-CM

## 2024-09-03 DIAGNOSIS — I471 Supraventricular tachycardia, unspecified: Secondary | ICD-10-CM | POA: Diagnosis not present

## 2024-09-03 DIAGNOSIS — J1100 Influenza due to unidentified influenza virus with unspecified type of pneumonia: Secondary | ICD-10-CM | POA: Diagnosis not present

## 2024-09-03 DIAGNOSIS — I4891 Unspecified atrial fibrillation: Secondary | ICD-10-CM | POA: Diagnosis not present

## 2024-09-03 DIAGNOSIS — G309 Alzheimer's disease, unspecified: Secondary | ICD-10-CM | POA: Diagnosis not present

## 2024-09-03 DIAGNOSIS — N184 Chronic kidney disease, stage 4 (severe): Secondary | ICD-10-CM | POA: Diagnosis not present

## 2024-09-03 DIAGNOSIS — J9601 Acute respiratory failure with hypoxia: Secondary | ICD-10-CM | POA: Diagnosis not present

## 2024-09-03 DIAGNOSIS — N179 Acute kidney failure, unspecified: Secondary | ICD-10-CM | POA: Diagnosis not present

## 2024-09-03 DIAGNOSIS — E43 Unspecified severe protein-calorie malnutrition: Secondary | ICD-10-CM | POA: Diagnosis not present

## 2024-09-03 DIAGNOSIS — D631 Anemia in chronic kidney disease: Secondary | ICD-10-CM | POA: Diagnosis not present

## 2024-09-03 LAB — CBC WITH DIFFERENTIAL/PLATELET
Abs Immature Granulocytes: 0.11 K/uL — ABNORMAL HIGH (ref 0.00–0.07)
Basophils Absolute: 0 K/uL (ref 0.0–0.1)
Basophils Relative: 0 %
Eosinophils Absolute: 0.3 K/uL (ref 0.0–0.5)
Eosinophils Relative: 4 %
HCT: 17.4 % — ABNORMAL LOW (ref 39.0–52.0)
Hemoglobin: 5.5 g/dL — ABNORMAL LOW (ref 13.0–17.0)
Immature Granulocytes: 2 %
Lymphocytes Relative: 36 %
Lymphs Abs: 2.5 K/uL (ref 0.7–4.0)
MCH: 30.4 pg (ref 26.0–34.0)
MCHC: 31.6 g/dL (ref 30.0–36.0)
MCV: 96.1 fL (ref 80.0–100.0)
Monocytes Absolute: 0.6 K/uL (ref 0.1–1.0)
Monocytes Relative: 9 %
Neutro Abs: 3.3 K/uL (ref 1.7–7.7)
Neutrophils Relative %: 49 %
Platelets: 150 K/uL (ref 150–400)
RBC: 1.81 MIL/uL — ABNORMAL LOW (ref 4.22–5.81)
RDW: 19.3 % — ABNORMAL HIGH (ref 11.5–15.5)
WBC: 6.9 K/uL (ref 4.0–10.5)
nRBC: 0 % (ref 0.0–0.2)

## 2024-09-03 LAB — CREATININE, SERUM
Creatinine, Ser: 2.14 mg/dL — ABNORMAL HIGH (ref 0.61–1.24)
GFR, Estimated: 29 mL/min — ABNORMAL LOW (ref >60)

## 2024-09-03 LAB — CULTURE, BLOOD (ROUTINE X 2)
Culture: NO GROWTH
Culture: NO GROWTH

## 2024-09-03 LAB — PREPARE RBC (CROSSMATCH)

## 2024-09-03 LAB — TECHNOLOGIST SMEAR REVIEW: Plt Morphology: NORMAL

## 2024-09-03 LAB — COMPREHENSIVE METABOLIC PANEL WITH GFR
ALT: 117 U/L — ABNORMAL HIGH (ref 0–44)
AST: 119 U/L — ABNORMAL HIGH (ref 15–41)
Albumin: 1.9 g/dL — ABNORMAL LOW (ref 3.5–5.0)
Alkaline Phosphatase: 76 U/L (ref 38–126)
Anion gap: 13 (ref 5–15)
BUN: 113 mg/dL — ABNORMAL HIGH (ref 8–23)
CO2: 20 mmol/L — ABNORMAL LOW (ref 22–32)
Calcium: 7.7 mg/dL — ABNORMAL LOW (ref 8.9–10.3)
Chloride: 108 mmol/L (ref 98–111)
Creatinine, Ser: 2.14 mg/dL — ABNORMAL HIGH (ref 0.61–1.24)
GFR, Estimated: 29 mL/min — ABNORMAL LOW (ref >60)
Glucose, Bld: 110 mg/dL — ABNORMAL HIGH (ref 70–99)
Potassium: 3.4 mmol/L — ABNORMAL LOW (ref 3.5–5.1)
Sodium: 142 mmol/L (ref 135–145)
Total Bilirubin: 0.4 mg/dL (ref 0.0–1.2)
Total Protein: 4.1 g/dL — ABNORMAL LOW (ref 6.5–8.1)

## 2024-09-03 LAB — RETIC PANEL
Immature Retic Fract: 21.9 % — ABNORMAL HIGH (ref 2.3–15.9)
RBC.: 1.77 MIL/uL — ABNORMAL LOW (ref 4.22–5.81)
Retic Count, Absolute: 17.5 K/uL — ABNORMAL LOW (ref 19.0–186.0)
Retic Ct Pct: 1 % (ref 0.4–3.1)
Reticulocyte Hemoglobin: 24.9 pg — ABNORMAL LOW (ref >27.9)

## 2024-09-03 LAB — CBC
HCT: 15.2 % — ABNORMAL LOW (ref 39.0–52.0)
Hemoglobin: 5 g/dL — ABNORMAL LOW (ref 13.0–17.0)
MCH: 31.3 pg (ref 26.0–34.0)
MCHC: 32.9 g/dL (ref 30.0–36.0)
MCV: 95 fL (ref 80.0–100.0)
Platelets: 139 K/uL — ABNORMAL LOW (ref 150–400)
RBC: 1.6 MIL/uL — ABNORMAL LOW (ref 4.22–5.81)
RDW: 19.2 % — ABNORMAL HIGH (ref 11.5–15.5)
WBC: 6.7 K/uL (ref 4.0–10.5)
nRBC: 0 % (ref 0.0–0.2)

## 2024-09-03 LAB — LACTATE DEHYDROGENASE: LDH: 173 U/L (ref 98–192)

## 2024-09-03 MED ORDER — AMIODARONE HCL 200 MG PO TABS: 200.0000 mg | ORAL_TABLET | Freq: Two times a day (BID) | ORAL | Status: DC

## 2024-09-03 MED ORDER — AMIODARONE HCL 200 MG PO TABS: 200.0000 mg | ORAL_TABLET | Freq: Every day | ORAL | Status: DC

## 2024-09-03 MED ORDER — SODIUM CHLORIDE 0.9% IV SOLUTION: Freq: Once | INTRAVENOUS | Status: AC

## 2024-09-03 MED ORDER — SODIUM CHLORIDE 0.9 % IV SOLN
200.0000 mg | Freq: Once | INTRAVENOUS | Status: DC
  Filled 2024-09-03: qty 10

## 2024-09-03 MED ORDER — IRON SUCROSE 200 MG IVPB - SIMPLE MED
200.0000 mg | Freq: Once | Status: AC
  Administered 2024-09-03: 200 mg via INTRAVENOUS
  Filled 2024-09-03: qty 200

## 2024-09-03 MED ORDER — AMIODARONE HCL 200 MG PO TABS
400.0000 mg | ORAL_TABLET | Freq: Two times a day (BID) | ORAL | Status: DC
  Administered 2024-09-03 (×2): 400 mg via ORAL
  Filled 2024-09-03 (×5): qty 2

## 2024-09-03 NOTE — Progress Notes (Signed)
 PT Cancellation Note  Patient Details Name: Adrian Michael MRN: [id number] DOB: 1935-01-10   Cancelled Treatment:    Reason Eval/Treat Not Completed: Patient not medically ready. Per EMR pt with Hg of 5.5 and plan for blood transfusion. PT to hold and re-assess Hg after transfusion before re-attempting PT efforts. Will continue to follow.    [name] Fairly IV, PT, DPT Physical Therapist- [place]  [facility] 09/03/2024, 11:25 AM

## 2024-09-03 NOTE — TOC Progression Note (Signed)
 Transition of Care [facility]) - Progression Note    Patient Details  Name: Adrian Michael MRN: [id number] Date of Birth: 12/09/1935  Transition of Care [facility]) CM/SW Contact  Lauraine JAYSON Carpen, LCSW Phone Number: 09/03/2024, 10:15 AM  Clinical Narrative:   CSW spoke to [place] at [facility]. Provided update. Their nurse is not in today but will be back on Monday.  Expected Discharge Plan:  (TBD) Barriers to Discharge: Continued Medical Work up               Expected Discharge Plan and Services     Post Acute Care Choice:  (TBD) Living arrangements for the past 2 months: Single Family Home                                       Social Drivers of Health (SDOH) Interventions SDOH Screenings   Food Insecurity: No Food Insecurity (08/30/2024)  Housing: Low Risk  (08/30/2024)  Transportation Needs: No Transportation Needs (08/30/2024)  Utilities: Not At Risk (08/30/2024)  Social Connections: Patient Unable To Answer (08/30/2024)  Tobacco Use: Low Risk  (06/05/2021)   Received from [facility]    Readmission Risk Interventions     No data to display

## 2024-09-03 NOTE — Progress Notes (Signed)
  Progress Note  Patient Name: Adrian Michael Date of Encounter: 09/03/2024 [facility] Cardiologist: New- Gollan  Interval Summary    Patient not very responsive. He is aware he is in the hospital, but unsure why he is here. No pain reported. He remains in NSR.  Hgb down to 5 and transfusion was ordered.  Vital Signs Vitals:   09/03/24 0823 09/03/24 0859 09/03/24 0900 09/03/24 0930  BP: (!) 115/51 (!) 117/35 (!) 117/35 (!) 117/51  Pulse: 62 62 62 66  Resp: 20 18 18 18   Temp: (!) 97.4 F (36.3 C) 98 F (36.7 C) 98 F (36.7 C) 97.6 F (36.4 C)  TempSrc: Axillary   Oral  SpO2: 95% 95% 95% 95%  Weight:      Height:        Intake/Output Summary (Last 24 hours) at 09/03/2024 1016 Last data filed at 09/03/2024 0915 Gross per 24 hour  Intake 3175.38 ml  Output 1875 ml  Net 1300.38 ml      08/29/2024    9:12 PM 08/29/2024    6:05 PM  Last 3 Weights  Weight (lbs) 134 lb 14.7 oz 135 lb  Weight (kg) 61.2 kg 61.236 kg      Telemetry/ECG  NSR HR 60-70s - Personally Reviewed  Physical Exam  GEN: No acute distress.   Neck: No JVD Cardiac: RRR, no murmurs, rubs, or gallops.  Respiratory: Clear to auscultation bilaterally. GI: Soft, nontender, non-distended  MS: No edema  Relevant CV studies:   1. Left ventricular ejection fraction, by estimation, is 60 to 65%. The  left ventricle has normal function. The left ventricle has no regional  wall motion abnormalities. Left ventricular diastolic parameters were  normal. The global longitudinal strain  is normal.   2. Right ventricular systolic function is normal. The right ventricular  size is normal.   3. The mitral valve is normal in structure. Mild mitral valve  regurgitation. No evidence of mitral stenosis.   4. The aortic valve is normal in structure. Aortic valve regurgitation is  mild to moderate. Aortic valve sclerosis/calcification is present, without  any evidence of aortic stenosis.   5. The inferior vena  cava is normal in size with greater than 50%  respiratory variability, suggesting right atrial pressure of 3 mmHg.      Patient Profile: Adrian Michael is a 89 y.o. male with a hx of advanced dementia, anorexia, chronic kidney disease, presenting with shortness of breath, weakness, influenza, acute onset tachycardia    Assessment & Plan   SVT Afib - patient admitted for influenza/PNA, anemia found to have tachyarrhythmia. He has baseline dementia - 9/3 in the PM he was noted to have SVT started on IV amio with subsequent conversion to Afib then NSR  - patient remaines in NSR - given Afib less than 24 hours, anemia, age and other comorbidities patient is not a good candidate for a/c - continue to monitor tele - recent echo showed low EF - continue IV amiodarone  for now   Anemia - hgb 7.2>5 - he is receiving transfusion - per IM   Influenza/PNA - per CCM   AKI on CKD stage IV - Scr improving Scr 2.03    For questions or updates, please contact [facility] Please consult [email or web address] for contact info under       Signed, Adrian Michael Adrian Fishman, PA-C

## 2024-09-03 NOTE — Progress Notes (Signed)
 Palliative Care Progress Note, Assessment & Plan   Patient Name: Adrian Michael       Date: 09/03/2024 DOB: 25-Apr-1935  Age: 89 y.o. MRN#: [id number] Attending Physician: Adrian Qualia, MD Primary Care Physician: Adrian Area, MD Admit Date: 08/29/2024  Subjective: Patient is sitting up in bed, sleeping.  However, he easily awakens to my presence.  He says hello to me and then makes no further vocalizations.  No family or friends present during my visit.  HPI: 89 y.o. male  with past medical history of hypertension, mixed Alzheimer's and vascular dementia, GERD, hypothyroidism, type 2 diabetes, CKD (3B), gout, dyslipidemia, BPH, and hyperparathyroidism admitted on 08/29/2024 with generalized weakness, fever, poor appetite, productive cough, and shortness of breath.   Upon arrival to ED, patient's oxygen saturation was in the 80s and patient was placed on supplemental oxygen via nonrebreather mask.   Of note, patient tested positive for influenza on Wednesday, 08/25/2024.   As per chart review, patient was a resident in [place] for was malnourished and recently admitted to ICU.  Discharge, patient was sent to a rehab facility where he spent several weeks.  His daughter/HCPOA when he then decided to bring him to  -6 days prior to admission.   Radiograph of chest revealed mild patchy airspace opacities in both lower lobes-concerning for pneumonia.   Patient is being treated for influenzal pneumonia, acute hypoxic respiratory failure, elevated troponins, hypernatremia, anemia, elevated BNP, and protein calorie malnutrition.   PMT was consulted given patient's failure to thrive and to support patient and family with goals of care discussions.  Summary of counseling/coordination of  care: Extensive chart review completed prior to meeting patient including labs, vital signs, imaging, progress notes, orders, and available advanced directive documents from current and previous encounters.   After reviewing the patient's chart and assessing the patient at bedside, I spoke with patient in regards to symptom management and goals of care.   Symptoms assessed.  Physical assessment reveals left upper quadrant tenderness with guarding.  However, when asked if he had pain in his belly he shook his head no.  Nonverbal signs of pain, discomfort and distress not noted until I palpated the left side of his stomach.  Discussed findings with attending.  She has consulted hematology.  I recommended CT scan of abdomen and order placed by attending.  After counseling with attending, I spoke with patient's daughter Adrian Michael over the phone. Discussed drop in patient's hemoglobin and possible causes, such as leukemia or bleeding. Reviewed giving 2 PRBCs with continued drop in Hgb.  Conveyed that hematology has been consulted and CT scan of the abdomen has been ordered.  Adrian Michael shares understanding and appreciation for this update.  She continues to remain in agreement to treat the treatable and investigate for causes of drop in hemoglobin.  DNR with full interventions remains.  Adrian Michael aware that I am off service until Tuesday.  She has PMT contact info and was advised to reach out with any acute palliative needs.  Otherwise, I plan to follow-up upon my return on Tuesday.  Should needs arise before that time, please utilize [facility]ION for on-call palliative providers.   Physical Exam Vitals reviewed.  Constitutional:  General: He is not in acute distress.    Comments: thin  HENT:     Head:     Comments: Temporal wasting    Mouth/Throat:     Mouth: Mucous membranes are moist.  Eyes:     Pupils: Pupils are equal, round, and reactive to light.  Pulmonary:     Effort: Pulmonary effort is  normal.  Abdominal:     Palpations: Abdomen is soft.  Skin:    General: Skin is warm and dry.     Coloration: Skin is pale.  Neurological:     Mental Status: He is alert.  Psychiatric:        Mood and Affect: Mood normal.        Behavior: Behavior normal.             Total Time 35 minutes   Time spent includes: Detailed review of medical records (labs, imaging, vital signs), medically appropriate exam (mental status, respiratory, cardiac, skin), discussed with treatment team, counseling and educating patient, family and staff, documenting clinical information, medication management and coordination of care.  Adrian L. Arvid, DNP, FNP-BC Palliative Medicine Team

## 2024-09-03 NOTE — Assessment & Plan Note (Signed)
 Likely secondary to influenza pneumonia, no baseline oxygen use.  Initially required up to 15 L of oxygen,  Now weaned back to room air. - Continue supplemental oxygen as needed

## 2024-09-03 NOTE — Progress Notes (Signed)
 Dr. Caleen paged regarding pt's AM Hgb of 5.0. Orders received for transfusion of 2 units PRBCs. Day RN updated.

## 2024-09-03 NOTE — Consult Note (Addendum)
 Hematology/Oncology Consult note [phone] [phone]      Patient Care Team: Godfrey Area, MD as PCP - General (Internal Medicine) Perla Evalene PARAS, MD as PCP - Cardiology (Cardiology)   Name of the patient: Adrian Michael  [id number]  10-27-1935   REASON FOR COSULTATION:  Anemia History of presenting illness-  89 y.o. male with PMH listed at below who is currently hospitalized due to influenza pneumonia.  Patient is a poor historian due to dementia.  Medical history was obtained from reviewing records.  Patient relocated from [facility] to [place]  to be closer to daughter. Patient was found to have hypoxia with oxygen level in the 80s.  Placed on oxygen.  Chest x-ray showed patchy airspace opacities in both lower lobes concerning for pneumonia.  Respiratory panel positive for influenza A.  Patient was treated with Tamiflu  and antibiotics with concern of superimposed bacterial pneumonia.  Upon admission, patient has anemia with hemoglobin 9.8, hemoglobin gradually decreased during current admission.  No active bleeding events.  Patient has received 2 units of PRBC transfusion, 1 dose of Aranesp  and 1 dose of IV iron . Hematology was consulted for further evaluation.  No Known Allergies  Patient Active Problem List   Diagnosis Date Noted   Protein-calorie malnutrition, severe 09/01/2024   Hypernatremia 09/01/2024   Pressure injury of skin 09/01/2024   Acute respiratory failure with hypoxia (HCC) 09/01/2024   Anemia 09/01/2024   Cardiac arrhythmia 09/01/2024   Influenza A 09/01/2024   Influenzal pneumonia 08/29/2024   Acute kidney injury superimposed on chronic kidney disease (HCC) 08/29/2024   Elevated troponin I level 08/29/2024   Essential hypertension 08/29/2024   Gout 08/29/2024   BPH (benign prostatic hyperplasia) 08/29/2024   Mixed Alzheimer's and vascular dementia (HCC) 08/29/2024   Elevated brain natriuretic peptide (BNP)  level 08/29/2024     History reviewed. No pertinent past medical history.   History reviewed. No pertinent surgical history.  Social History   Socioeconomic History   Marital status: Married    Spouse name: Not on file   Number of children: Not on file   Years of education: Not on file   Highest education level: Not on file  Occupational History   Not on file  Tobacco Use   Smoking status: Not on file   Smokeless tobacco: Not on file  Substance and Sexual Activity   Alcohol use: Not on file   Drug use: Not on file   Sexual activity: Not on file  Other Topics Concern   Not on file  Social History Narrative   Not on file   Social Drivers of Health   Financial Resource Strain: Not on file  Food Insecurity: No Food Insecurity (08/30/2024)   Hunger Vital Sign    Worried About Running Out of Food in the Last Year: Never true    Ran Out of Food in the Last Year: Never true  Transportation Needs: No Transportation Needs (08/30/2024)   PRAPARE - Administrator, Civil Service (Medical): No    Lack of Transportation (Non-Medical): No  Physical Activity: Not on file  Stress: Not on file  Social Connections: Patient Unable To Answer (08/30/2024)   Social Connection and Isolation Panel    Frequency of Communication with Friends and Family: Patient unable to answer    Frequency of Social Gatherings with Friends and Family: Patient unable to answer    Attends Religious Services: Patient unable to answer    Active Member of Clubs or  Organizations: Patient unable to answer    Attends Club or Organization Meetings: Patient unable to answer    Marital Status: Patient unable to answer  Intimate Partner Violence: Not At Risk (08/30/2024)   Humiliation, Afraid, Rape, and Kick questionnaire    Fear of Current or Ex-Partner: No    Emotionally Abused: No    Physically Abused: No    Sexually Abused: No     History reviewed. No pertinent family history.   Current  Facility-Administered Medications:    (feeding supplement) PROSource Plus liquid 30 mL, 30 mL, Oral, BID BM, Amin, Sumayya, MD, 30 mL at 09/03/24 1313   acetaminophen  (TYLENOL ) tablet 650 mg, 650 mg, Oral, Q6H PRN, 650 mg at 09/03/24 1313 **OR** acetaminophen  (TYLENOL ) suppository 650 mg, 650 mg, Rectal, Q6H PRN, Mansy, Jan A, MD   allopurinol  (ZYLOPRIM ) tablet 100 mg, 100 mg, Oral, Daily, Mansy, Jan A, MD, 100 mg at 09/03/24 9145   amiodarone  (PACERONE ) tablet 400 mg, 400 mg, Oral, BID, 400 mg at 09/03/24 1232 **FOLLOWED BY** [START ON 09/10/2024] amiodarone  (PACERONE ) tablet 200 mg, 200 mg, Oral, BID **FOLLOWED BY** [START ON 09/17/2024] amiodarone  (PACERONE ) tablet 200 mg, 200 mg, Oral, Daily, End, Christopher, MD   ascorbic acid  (VITAMIN C ) tablet 500 mg, 500 mg, Oral, BID, Amin, Sumayya, MD, 500 mg at 09/03/24 9145   azithromycin  (ZITHROMAX ) 500 mg in sodium chloride  0.9 % 250 mL IVPB, 500 mg, Intravenous, Q24H, Mansy, Jan A, MD, Stopped at 09/02/24 1938   cefTRIAXone  (ROCEPHIN ) 2 g in sodium chloride  0.9 % 100 mL IVPB, 2 g, Intravenous, Q24H, Mansy, Jan A, MD, Stopped at 09/02/24 1832   Chlorhexidine  Gluconate Cloth 2 % PADS 6 each, 6 each, Topical, Daily, Amin, Sumayya, MD, 6 each at 09/03/24 1000   chlorpheniramine-HYDROcodone (TUSSIONEX) 10-8 MG/5ML suspension 5 mL, 5 mL, Oral, Q12H PRN, Mansy, Jan A, MD   donepezil  (ARICEPT ) tablet 5 mg, 5 mg, Oral, QHS, Mansy, Jan A, MD, 5 mg at 09/02/24 2234   Fe Fum-Vit C-Vit B12-FA (TRIGELS-F FORTE) capsule 1 capsule, 1 capsule, Oral, BID, Amin, Sumayya, MD, 1 capsule at 09/03/24 0857   feeding supplement (ENSURE PLUS HIGH PROTEIN) liquid 237 mL, 237 mL, Oral, TID BM, Amin, Sumayya, MD, 237 mL at 09/03/24 1313   guaiFENesin  (ROBITUSSIN) 100 MG/5ML liquid 15 mL, 15 mL, Oral, Q6H, Belue, Nathan S, RPH, 15 mL at 09/03/24 1232   heparin  injection 5,000 Units, 5,000 Units, Subcutaneous, Q8H, Jens Durand, MD, 5,000 Units at 09/03/24 1233    ipratropium-albuterol  (DUONEB) 0.5-2.5 (3) MG/3ML nebulizer solution 3 mL, 3 mL, Nebulization, Q4H PRN, Ayiku, Bernard, MD   levothyroxine  (SYNTHROID ) tablet 50 mcg, 50 mcg, Oral, Daily, Amin, Sumayya, MD, 50 mcg at 09/03/24 9365   magnesium  hydroxide (MILK OF MAGNESIA) suspension 30 mL, 30 mL, Oral, Daily PRN, Mansy, Jan A, MD   memantine  (NAMENDA ) tablet 5 mg, 5 mg, Oral, Daily, Amin, Sumayya, MD, 5 mg at 09/03/24 9145   mirtazapine  (REMERON ) tablet 7.5 mg, 7.5 mg, Oral, QHS, Amin, Sumayya, MD, 7.5 mg at 09/02/24 2234   multivitamin with minerals tablet 1 tablet, 1 tablet, Oral, Daily, Amin, Sumayya, MD, 1 tablet at 09/03/24 0854   mupirocin  ointment (BACTROBAN ) 2 % 1 Application, 1 Application, Nasal, BID, Amin, Sumayya, MD, 1 Application at 09/03/24 0901   ondansetron  (ZOFRAN ) tablet 4 mg, 4 mg, Oral, Q6H PRN **OR** ondansetron  (ZOFRAN ) injection 4 mg, 4 mg, Intravenous, Q6H PRN, Mansy, Jan A, MD   oseltamivir  (TAMIFLU ) capsule 30 mg, 30 mg,  Oral, Daily, Ayiku, Bernard, MD, 30 mg at 09/03/24 9143   simvastatin  (ZOCOR ) tablet 20 mg, 20 mg, Oral, QHS, Amin, Sumayya, MD, 20 mg at 09/02/24 2234   tamsulosin  (FLOMAX ) capsule 0.4 mg, 0.4 mg, Oral, QPC supper, Mansy, Jan A, MD, 0.4 mg at 09/02/24 1823   traZODone  (DESYREL ) tablet 25 mg, 25 mg, Oral, QHS PRN, Mansy, Jan A, MD   [START ON 09/04/2024] vancomycin  (VANCOCIN ) IVPB 1000 mg/200 mL premix, 1,000 mg, Intravenous, Q48H, Niels Barrio F, RPH   zinc  sulfate (50mg  elemental zinc ) capsule 220 mg, 220 mg, Oral, Daily, Amin, Sumayya, MD, 220 mg at 09/03/24 0854  Review of Systems  Unable to perform ROS: Dementia    PHYSICAL EXAM Vitals:   09/03/24 0930 09/03/24 1202 09/03/24 1220 09/03/24 1301  BP: (!) 117/51 (!) 116/58 (!) 125/54 127/61  Pulse: 66 65 66 67  Resp: 18 16 15 18   Temp: 97.6 F (36.4 C) 97.7 F (36.5 C) 98.4 F (36.9 C) 98.6 F (37 C)  TempSrc: Oral Oral Oral Oral  SpO2: 95% 99% 96% 96%  Weight:      Height:        Physical Exam Constitutional:      General: He is not in acute distress. HENT:     Head: Normocephalic.  Eyes:     General: No scleral icterus. Cardiovascular:     Rate and Rhythm: Normal rate and regular rhythm.  Pulmonary:     Effort: Pulmonary effort is normal. No respiratory distress.  Abdominal:     General: Bowel sounds are normal. There is no distension.     Palpations: Abdomen is soft.  Musculoskeletal:        General: Normal range of motion.     Cervical back: Normal range of motion and neck supple.  Skin:    General: Skin is dry.     Coloration: Skin is pale.     Findings: No erythema.  Neurological:     Mental Status: He is alert. Mental status is at baseline.     Motor: No abnormal muscle tone.  Psychiatric:        Mood and Affect: Affect normal.       LABORATORY STUDIES    Latest Ref Rng & Units 09/03/2024    9:50 AM 09/03/2024    4:37 AM 09/02/2024    2:14 AM  CBC  WBC 4.0 - 10.5 K/uL 6.9  6.7  5.4   Hemoglobin 13.0 - 17.0 g/dL 5.5  5.0  7.2   Hematocrit 39.0 - 52.0 % 17.4  15.2  22.2   Platelets 150 - 400 K/uL 150  139  155       Latest Ref Rng & Units 09/03/2024    9:50 AM 09/03/2024    4:37 AM 09/02/2024    2:14 AM  CMP  Glucose 70 - 99 mg/dL 889   881   BUN 8 - 23 mg/dL 886   881   Creatinine 0.61 - 1.24 mg/dL 7.85  7.85  7.96   Sodium 135 - 145 mmol/L 142   149   Potassium 3.5 - 5.1 mmol/L 3.4   3.9   Chloride 98 - 111 mmol/L 108   112   CO2 22 - 32 mmol/L 20   26   Calcium  8.9 - 10.3 mg/dL 7.7   8.2   Total Protein 6.5 - 8.1 g/dL 4.1     Total Bilirubin 0.0 - 1.2 mg/dL 0.4     Alkaline Phos  38 - 126 U/L 76     AST 15 - 41 U/L 119     ALT 0 - 44 U/L 117        RADIOGRAPHIC STUDIES: I have personally reviewed the radiological images as listed and agreed with the findings in the report. ECHOCARDIOGRAM COMPLETE Result Date: 08/30/2024    ECHOCARDIOGRAM REPORT   Patient Name:   JAVARION DOUTY Date of Exam: 08/30/2024 Medical Rec #:  [id number]    Height:       67.0 in Accession #:    [id number]  Weight:       134.9 lb Date of Birth:  02/22/1935   BSA:          1.711 m Patient Age:    89 years    BP:           134/58 mmHg Patient Gender: M           HR:           73 bpm. Exam Location:  [facility] Procedure: 2D Echo, Cardiac Doppler, Color Doppler and Strain Analysis (Both            Spectral and Color Flow Doppler were utilized during procedure). Indications:     Elevated Troponin  History:         Patient has no prior history of Echocardiogram examinations.  Sonographer:     Thedora Louder RDCS, FASE Referring Phys:  [id number] MADISON A MANSY Diagnosing Phys: Denyse Bathe  Sonographer Comments: Global longitudinal strain was attempted. Challenging study due to patient's low acoustic windows. IMPRESSIONS  1. Left ventricular ejection fraction, by estimation, is 60 to 65%. The left ventricle has normal function. The left ventricle has no regional wall motion abnormalities. Left ventricular diastolic parameters were normal. The global longitudinal strain is normal.  2. Right ventricular systolic function is normal. The right ventricular size is normal.  3. The mitral valve is normal in structure. Mild mitral valve regurgitation. No evidence of mitral stenosis.  4. The aortic valve is normal in structure. Aortic valve regurgitation is mild to moderate. Aortic valve sclerosis/calcification is present, without any evidence of aortic stenosis.  5. The inferior vena cava is normal in size with greater than 50% respiratory variability, suggesting right atrial pressure of 3 mmHg. FINDINGS  Left Ventricle: Left ventricular ejection fraction, by estimation, is 60 to 65%. The left ventricle has normal function. The left ventricle has no regional wall motion abnormalities. Strain was performed and the global longitudinal strain is normal. The  left ventricular internal cavity size was normal in size. There is no left ventricular hypertrophy. Left ventricular diastolic parameters  were normal. Right Ventricle: The right ventricular size is normal. No increase in right ventricular wall thickness. Right ventricular systolic function is normal. Left Atrium: Left atrial size was normal in size. Right Atrium: Right atrial size was normal in size. Pericardium: There is no evidence of pericardial effusion. Mitral Valve: The mitral valve is normal in structure. Mild mitral valve regurgitation. No evidence of mitral valve stenosis. Tricuspid Valve: The tricuspid valve is normal in structure. Tricuspid valve regurgitation is trivial. No evidence of tricuspid stenosis. Aortic Valve: The aortic valve is normal in structure. Aortic valve regurgitation is mild to moderate. Aortic regurgitation PHT measures 810 msec. Aortic valve sclerosis/calcification is present, without any evidence of aortic stenosis. Aortic valve peak  gradient measures 5.7 mmHg. Pulmonic Valve: The pulmonic valve was normal in structure. Pulmonic valve regurgitation is not visualized. No evidence of  pulmonic stenosis. Aorta: The aortic root is normal in size and structure. Venous: The inferior vena cava is normal in size with greater than 50% respiratory variability, suggesting right atrial pressure of 3 mmHg. IAS/Shunts: No atrial level shunt detected by color flow Doppler. Additional Comments: 3D was performed not requiring image post processing on an independent workstation and was indeterminate.  LEFT VENTRICLE PLAX 2D LVIDd:         4.40 cm   Diastology LVIDs:         3.10 cm   LV e' medial:    6.85 cm/s LV PW:         1.30 cm   LV E/e' medial:  8.7 LV IVS:        1.20 cm   LV e' lateral:   6.74 cm/s LVOT diam:     2.40 cm   LV E/e' lateral: 8.9 LV SV:         95 LV SV Index:   55 LVOT Area:     4.52 cm  RIGHT VENTRICLE RV Basal diam:  3.70 cm RV S prime:     14.80 cm/s TAPSE (M-mode): 2.0 cm LEFT ATRIUM             Index        RIGHT ATRIUM           Index LA diam:        3.40 cm 1.99 cm/m   RA Area:     16.80 cm LA Vol  (A2C):   36.7 ml 21.45 ml/m  RA Volume:   45.00 ml  26.30 ml/m LA Vol (A4C):   27.6 ml 16.13 ml/m LA Biplane Vol: 32.1 ml 18.76 ml/m  AORTIC VALVE                 PULMONIC VALVE AV Area (Vmax): 3.34 cm     PV Vmax:          1.15 m/s AV Vmax:        119.00 cm/s  PV Peak grad:     5.3 mmHg AV Peak Grad:   5.7 mmHg     PR End Diast Vel: 5.57 msec LVOT Vmax:      87.80 cm/s   RVOT Peak grad:   3 mmHg LVOT Vmean:     61.200 cm/s LVOT VTI:       0.209 m AI PHT:         810 msec  AORTA Ao Root diam: 3.70 cm MITRAL VALVE               TRICUSPID VALVE MV Area (PHT): 3.08 cm    TR Peak grad:   30.2 mmHg MV Decel Time: 246 msec    TR Vmax:        275.00 cm/s MV E velocity: 59.70 cm/s MV A velocity: 59.70 cm/s  SHUNTS MV E/A ratio:  1.00        Systemic VTI:  0.21 m                            Systemic Diam: 2.40 cm Denyse Bathe Electronically signed by Denyse Bathe Signature Date/Time: 08/30/2024/10:59:50 AM    Final    CT Head Wo Contrast Result Date: 08/29/2024 CLINICAL DATA:  Mental status change, unknown cause EXAM: CT HEAD WITHOUT CONTRAST TECHNIQUE: Contiguous axial images were obtained from the base of the skull through the vertex without intravenous contrast. RADIATION  DOSE REDUCTION: This exam was performed according to the departmental dose-optimization program which includes automated exposure control, adjustment of the mA and/or kV according to patient size and/or use of iterative reconstruction technique. COMPARISON:  None Available. FINDINGS: Brain: There is periventricular white matter decreased attenuation consistent with small vessel ischemic changes. Ventricles, sulci and cisterns are prominent consistent with age related involutional changes. No acute intracranial hemorrhage, mass effect or shift. No hydrocephalus. Vascular: No hyperdense vessel or unexpected calcification. Skull: Normal. Negative for fracture or focal lesion. Sinuses/Orbits: No acute finding. IMPRESSION: Atrophy and chronic small  vessel ischemic changes. No acute intracranial process identified. Electronically Signed   By: Fonda Field M.D.   On: 08/29/2024 18:39   DG Chest Port 1 View Result Date: 08/29/2024 CLINICAL DATA:  Shortness of breath EXAM: PORTABLE CHEST 1 VIEW COMPARISON:  None Available. FINDINGS: There are mild patchy airspace opacities in both lower lobes. There is no pleural effusion or pneumothorax. Cardiomediastinal silhouette demonstrates mild cardiomegaly. There is no pneumothorax or acute fracture. IMPRESSION: Mild patchy airspace opacities in both lower lobes, concerning for pneumonia. Follow-up PA and lateral chest x-ray recommended in 4-6 weeks to confirm resolution. Electronically Signed   By: Greig Pique M.D.   On: 08/29/2024 18:28     Assessment and plan-   # Acute on chronic anemia Multifactorial.  Anemia due to CKD, marrow suppression from recent infection. Iron  panel is not consistent with iron  deficiency however levels may be falsely elevated due to acute inflammation.  Reticulocyte panel showed decreased reticulocyte hemoglobin, indicating some component of iron  deficiency.  I agree with blood transfusion to keep hemoglobin above 7 from hematology aspect. I agree with IV Venofer . Patient has normal bilirubin, normal LDH.  Haptoglobin is pending.  Less likely hemolysis. Parvovirus is pending.  Check myeloma panel. Rule out retroperitoneal bleeding-CT is pending.  # AKI on CKD, creatinine has improved to be close to baseline. # Cardiac arrhythmia, cardiology on board.  On amiodarone  infusion. # Influenza pneumonia, on Tamiflu  and antibiotics.  Thank you for allowing me to participate in the care of this patient.   Zelphia Cap, MD, PhD Hematology Oncology 09/03/2024

## 2024-09-03 NOTE — Progress Notes (Signed)
 Progress Note   Patient: Adrian Michael FMW:[id number] DOB: 02-Sep-1935 DOA: 08/29/2024     5 DOS: the patient was seen and examined on 09/03/2024   Brief hospital course: Taken from prior notes.  Adrian Michael is a 89 y.o. male with medical history significant for hypertension, mixed Alzheimer's and vascular dementia, GERD, hypothyroidism, type II DM, CKD stage IIIb, gout, dyslipidemia, hyperparathyroidism, BPH, who presented to the hospital with generalized weakness, fever, poor appetite, productive cough and worsening shortness of breath.  Oxygen saturation was down into the 80s with EMS so he was placed on oxygen via nonrebreather mask and transported to the ED.  There is also report that he had tested positive for influenza on Wednesday, 08/25/2024.      Reportedly, patient had been living in [place] but was malnourished and had been recently admitted to the ICU.  He was discharged to a rehab facility where he spent several weeks.  His daughter decided to bring him to [facility]  (about 6 days prior to admission) so that she could take care of him.   Chest x-ray showed mild patchy airspace opacities in both lower lobes, concerning for pneumonia.  9/3: Hemodynamically stable, on 3 L of oxygen.  PT is recommending SNF.  Improving renal function.  Mild hypernatremia today with sodium of 148 and water  deficit of 1.7L Started on D5. Lengthy discussion with daughter, apparently patient is declining, he was becoming mostly bedbound since November 2024, few hospitalizations due to concern of severe dehydration and hypotension.  Refusing most of the p.o. intake and losing weight, since April 2025 he is spending most of his time in bed and sleeping.  Daughter wants him to go to [facility] memory care unit and is communicating with them.  TOC is aware Consulting palliative and dietitian.  Addendum.  Patient developed bigeminy with tachycardia, difficult to get underlying rhythm so cardiology was  consulted. Blood pressure was becoming soft, s/p 500 cc of bolus with some improvement.  Heart rate remained mostly in 140s.  Patient seems more lethargic, amiodarone  bolus followed by infusion was ordered after discussing with cardiology. Also ordered 1 unit of PRBC as ideal hemoglobin should be above 8 per cardiology. Patient is being transferred to stepdown, need to use pressors if needed as he already received quite a bit of fluid and developing some crackles.  9/4: Vital stable and converted back to sinus rhythm when seen today.  Little more alert.  Hemoglobin remained at 7.2 despite getting 2 unit of PRBC.  No worsening leukocytosis but procalcitonin at 4.02 and MRSA PCR positive-adding some vancomycin .  Cardiology continuing amiodarone  infusion.   9/5: Vital stable, hemoglobin with further worsening at 5, no obvious bleeding.  Smear with mixed RBC population and burr cells.  Checking Pavo virus, LDH normal,  CMP with some improvement in transaminitis but seems stable, T. bili normal.  Hematology was also consulted.  Checking FOBT and CT abdomen to rule out any occult bleeding.  Assessment and Plan: * Influenzal pneumonia Blood cultures remain negative and respiratory panel positive for influenza A. - Continue ceftriaxone  and Zithromax  to complete a 5-day course for concern of superadded bacterial pneumonia -Increasing procalcitonin so adding vancomycin  as MRSA PCR came back positive - Continue with Tamiflu    Acute respiratory failure with hypoxia (HCC) Likely secondary to influenza pneumonia, no baseline oxygen use.  Initially required up to 15 L of oxygen,  Now weaned back to room air. - Continue supplemental oxygen as needed  Acute kidney injury superimposed  on chronic kidney disease [facility]) Patient has an history of CKD stage IV. Improving renal function with creatinine at 2.03 today.  Seems to be around baseline now.  Metabolic acidosis has been resolved.  Nephrology is on board.   -Monitor renal function -Avoid nephrotoxins  Cardiac arrhythmia Patient developed sudden onset tachycardia with variable underlying rhythm, intermittent concern of SVT, bigeminy, A-fib/flutter. Cardiology was consulted and patient was given amiodarone  bolus followed by infusion. Converted back to sinus rhythm after midnight. - Cardiology is on board -Continue with amiodarone  infusion for now  Elevated troponin I level Likely demand ischemia.  Echocardiogram was normal. Cardiology was consulted-no need for any further investigation so cardiology signed off.  Essential hypertension Blood pressure currently within goal. -Amlodipine  was held due to softer blood pressure earlier. -Keep holding losartan  Hypernatremia Improved. -Monitor sodium Encourage hydration-  Anemia Hemoglobin further decreased to 5 s/p 2 unit of PRBC, 1 dose of Aranesp  and 1 dose of IV iron , no obvious bleeding.  Anemia panel with anemia of chronic disease and some iron  deficiency.  B12 and folate was normal . Cardiology would like to keep hemoglobin above 9 - Ordered 2 more unit of PRBC -Checking parvovirus panel -Smear with mixed population of RBC and burr cells -T. bili normal, LDH normal, haptoglobin pending -Checking FOBT and CT abdomen to rule out any occult bleeding -Hematology consulted  Elevated brain natriuretic peptide (BNP) level BNP of 413 on admission, clinically appears dry.  Echocardiogram was normal.  Likely due to CKD and respiratory distress with influenza pneumonia. - Monitor volume status closely  Mixed Alzheimer's and vascular dementia (HCC) - Will continue Aricept  and Namenda  -Patient oriented to name at baseline  Protein-calorie malnutrition, severe Estimated body mass index is 21.13 kg/m as calculated from the following:   Height as of this encounter: 5' 7 (1.702 m).   Weight as of this encounter: 61.2 kg.   - Dietitian consult  Gout - Will continue allopurinol .  BPH  (benign prostatic hyperplasia) - Will continue Flomax .  Pressure injury of skin - Frequent position change        Subjective: Patient was awake, oriented to self and able to tell that he is in hospital but not sure why he is here.  Denies any pain.  Physical Exam: Vitals:   09/03/24 1202 09/03/24 1220 09/03/24 1301 09/03/24 1515  BP: (!) 116/58 (!) 125/54 127/61 128/62  Pulse: 65 66 67   Resp: 16 15 18 20   Temp: 97.7 F (36.5 C) 98.4 F (36.9 C) 98.6 F (37 C) 97.6 F (36.4 C)  TempSrc: Oral Oral Oral Axillary  SpO2: 99% 96% 96% 98%  Weight:      Height:       General.  Frail and severely malnourished elderly man, in no acute distress. Pulmonary.  Lungs clear bilaterally, normal respiratory effort. CV.  Regular rate and rhythm, no JVD, rub or murmur. Abdomen.  Soft, nontender, nondistended, BS positive. CNS.  Alert and oriented to name and place.  No focal neurologic deficit. Extremities.  No edema, no cyanosis, pulses intact and symmetrical.  Data Reviewed: Prior data reviewed  Family Communication: Talked with daughter on phone.  Disposition: Status is: Inpatient Remains inpatient appropriate because: Severity of illness  Planned Discharge Destination: Skilled nursing facility  DVT prophylaxis.  Subcu heparin  Time spent: 50 minutes  This record has been created using Conservation officer, historic buildings. Errors have been sought and corrected,but may not always be located. Such creation errors do not  reflect on the standard of care.   Author: Amaryllis Dare, MD 09/03/2024 3:51 PM  For on call review [email or web address].

## 2024-09-04 DIAGNOSIS — J9601 Acute respiratory failure with hypoxia: Secondary | ICD-10-CM | POA: Diagnosis not present

## 2024-09-04 DIAGNOSIS — E43 Unspecified severe protein-calorie malnutrition: Secondary | ICD-10-CM | POA: Diagnosis not present

## 2024-09-04 DIAGNOSIS — G309 Alzheimer's disease, unspecified: Secondary | ICD-10-CM | POA: Diagnosis not present

## 2024-09-04 DIAGNOSIS — J1100 Influenza due to unidentified influenza virus with unspecified type of pneumonia: Secondary | ICD-10-CM | POA: Diagnosis not present

## 2024-09-04 LAB — TYPE AND SCREEN
ABO/RH(D): O POS
Antibody Screen: NEGATIVE
Unit division: 0
Unit division: 0
Unit division: 0
Unit division: 0

## 2024-09-04 LAB — BPAM RBC
Blood Product Expiration Date: 202510042359
Blood Product Expiration Date: 202510052359
Blood Product Expiration Date: 202510082359
Blood Product Expiration Date: 202510082359
ISSUE DATE / TIME: 202509021540
ISSUE DATE / TIME: 202509032141
ISSUE DATE / TIME: 202509050909
ISSUE DATE / TIME: 202509051240
Unit Type and Rh: 5100
Unit Type and Rh: 5100
Unit Type and Rh: 5100
Unit Type and Rh: 5100

## 2024-09-04 LAB — CBC
HCT: 20.5 % — ABNORMAL LOW (ref 39.0–52.0)
Hemoglobin: 6.8 g/dL — ABNORMAL LOW (ref 13.0–17.0)
MCH: 30.4 pg (ref 26.0–34.0)
MCHC: 33.2 g/dL (ref 30.0–36.0)
MCV: 91.5 fL (ref 80.0–100.0)
Platelets: 166 K/uL (ref 150–400)
RBC: 2.24 MIL/uL — ABNORMAL LOW (ref 4.22–5.81)
RDW: 18.2 % — ABNORMAL HIGH (ref 11.5–15.5)
WBC: 7.7 K/uL (ref 4.0–10.5)
nRBC: 0.3 % — ABNORMAL HIGH (ref 0.0–0.2)

## 2024-09-04 LAB — RETIC PANEL
Immature Retic Fract: 25.7 % — ABNORMAL HIGH (ref 2.3–15.9)
RBC.: 2.39 MIL/uL — ABNORMAL LOW (ref 4.22–5.81)
Retic Count, Absolute: 22 K/uL (ref 19.0–186.0)
Retic Ct Pct: 0.9 % (ref 0.4–3.1)
Reticulocyte Hemoglobin: 29 pg (ref >27.9)

## 2024-09-04 LAB — PREPARE RBC (CROSSMATCH)

## 2024-09-04 LAB — VITAMIN A: Vitamin A (Retinoic Acid): 20 ug/dL — ABNORMAL LOW (ref 22.0–69.5)

## 2024-09-04 MED ORDER — SODIUM CHLORIDE 0.9% IV SOLUTION: Freq: Once | INTRAVENOUS | Status: AC

## 2024-09-04 MED ORDER — MIRTAZAPINE 15 MG PO TABS
15.0000 mg | ORAL_TABLET | Freq: Every day | ORAL | Status: DC
  Administered 2024-09-04: 15 mg via ORAL
  Filled 2024-09-04: qty 1

## 2024-09-04 NOTE — Progress Notes (Signed)
 Progress Note   Patient: Adrian Michael FMW:[id number] DOB: Nov 06, 1935 DOA: 08/29/2024     6 DOS: the patient was seen and examined on 09/04/2024   Brief hospital course: Taken from prior notes.  Adrian Michael is a 89 y.o. male with medical history significant for hypertension, mixed Alzheimer's and vascular dementia, GERD, hypothyroidism, type II DM, CKD stage IIIb, gout, dyslipidemia, hyperparathyroidism, BPH, who presented to the hospital with generalized weakness, fever, poor appetite, productive cough and worsening shortness of breath.  Oxygen saturation was down into the 80s with EMS so he was placed on oxygen via nonrebreather mask and transported to the ED.  There is also report that he had tested positive for influenza on Wednesday, 08/25/2024.      Reportedly, patient had been living in [place] but was malnourished and had been recently admitted to the ICU.  He was discharged to a rehab facility where he spent several weeks.  His daughter decided to bring him to [place]  (about 6 days prior to admission) so that she could take care of him.   Chest x-ray showed mild patchy airspace opacities in both lower lobes, concerning for pneumonia.  9/3: Hemodynamically stable, on 3 L of oxygen.  PT is recommending SNF.  Improving renal function.  Mild hypernatremia today with sodium of 148 and water  deficit of 1.7L Started on D5. Lengthy discussion with daughter, apparently patient is declining, he was becoming mostly bedbound since November 2024, few hospitalizations due to concern of severe dehydration and hypotension.  Refusing most of the p.o. intake and losing weight, since April 2025 he is spending most of his time in bed and sleeping.  Daughter wants him to go to [facility] memory care unit and is communicating with them.  TOC is aware Consulting palliative and dietitian.  Addendum.  Patient developed bigeminy with tachycardia, difficult to get underlying rhythm so cardiology was  consulted. Blood pressure was becoming soft, s/p 500 cc of bolus with some improvement.  Heart rate remained mostly in 140s.  Patient seems more lethargic, amiodarone  bolus followed by infusion was ordered after discussing with cardiology. Also ordered 1 unit of PRBC as ideal hemoglobin should be above 8 per cardiology. Patient is being transferred to stepdown, need to use pressors if needed as he already received quite a bit of fluid and developing some crackles.  9/4: Vital stable and converted back to sinus rhythm when seen today.  Little more alert.  Hemoglobin remained at 7.2 despite getting 2 unit of PRBC.  No worsening leukocytosis but procalcitonin at 4.02 and MRSA PCR positive-adding some vancomycin .  Cardiology continuing amiodarone  infusion.   9/5: Vital stable, hemoglobin with further worsening at 5, no obvious bleeding.  Smear with mixed RBC population and burr cells.  Checking Pavo virus, LDH normal,  CMP with some improvement in transaminitis but seems stable, T. bili normal.  Hematology was also consulted.  Checking FOBT and CT abdomen to rule out any occult bleeding.  9/6: Vital stable, on room air, hemoglobin at 6.8 after getting 2 more unit of PRBC yesterday.  Oncology ordered multiple myeloma panel, parvovirus labs are pending, likely bone marrow suppression secondary to viral infection.  Ordered 1 more unit of PRBC.  Amiodarone  infusion is being switched with p.o.  Completed the course of Zithromax  and ceftriaxone , will complete total of 5 days of vancomycin .  Assessment and Plan: * Influenzal pneumonia Blood cultures remain negative and respiratory panel positive for influenza A. - Completed a 5-day course of ceftriaxone  and  Zithromax   -Increasing procalcitonin so adding vancomycin  as MRSA PCR came back positive-will do for total of 5 days - Completed a course of Tamiflu    Acute respiratory failure with hypoxia (HCC) Likely secondary to influenza pneumonia, no baseline  oxygen use.  Initially required up to 15 L of oxygen,  Now weaned back to room air. - Continue supplemental oxygen as needed  Acute kidney injury superimposed on chronic kidney disease (HCC) Patient has an history of CKD stage IV. Improving renal function with creatinine at 2.03 .  Seems to be around baseline now.  Metabolic acidosis has been resolved.  Nephrology is on board.  -Monitor renal function -Avoid nephrotoxins  Cardiac arrhythmia Patient developed sudden onset tachycardia with variable underlying rhythm, intermittent concern of SVT, bigeminy, A-fib/flutter. Cardiology was consulted and patient was given amiodarone  bolus followed by infusion. Converted back to sinus rhythm and remained in NSR - Cardiology is on board - Amiodarone  infusion is being switched with p.o. now  Elevated troponin I level Likely demand ischemia.  Echocardiogram was normal. Cardiology was consulted-no need for any further investigation so cardiology signed off.  Essential hypertension Blood pressure currently within goal. -Amlodipine  was held due to softer blood pressure earlier. -Keep holding losartan  Hypernatremia Improved. -Monitor sodium Encourage hydration-  Anemia Hemoglobin further decreased to 5 s/p 2 unit of PRBC, 1 dose of Aranesp  and 1 dose of IV iron , no obvious bleeding.  Anemia panel with anemia of chronic disease and some iron  deficiency.  B12 and folate was normal . Cardiology would like to keep hemoglobin above 9 - Ordered 2 more unit of PRBC -Checking parvovirus panel -Smear with mixed population of RBC and burr cells -T. bili normal, LDH normal, haptoglobin pending -Checking FOBT and CT abdomen to rule out any occult bleeding -Hematology consulted  Elevated brain natriuretic peptide (BNP) level BNP of 413 on admission, clinically appears dry.  Echocardiogram was normal.  Likely due to CKD and respiratory distress with influenza pneumonia. - Monitor volume status  closely  Mixed Alzheimer's and vascular dementia (HCC) - Will continue Aricept  and Namenda  -Patient oriented to name at baseline  Protein-calorie malnutrition, severe Estimated body mass index is 21.13 kg/m as calculated from the following:   Height as of this encounter: 5' 7 (1.702 m).   Weight as of this encounter: 61.2 kg.   - Dietitian consult  Gout - Will continue allopurinol .  BPH (benign prostatic hyperplasia) - Will continue Flomax .  Pressure injury of skin - Frequent position change        Subjective: Patient was seen and examined today.  Remained on room air.  Denies any pain.  Refusing most of the p.o. intake.  Physical Exam: Vitals:   09/04/24 0808 09/04/24 1210 09/04/24 1417 09/04/24 1441  BP: 127/64 (!) 114/53 (!) 125/57 (!) 122/58  Pulse: 81 76 75 74  Resp: 20 20 18 18   Temp: (!) 97.4 F (36.3 C) (!) 97.1 F (36.2 C) 98.1 F (36.7 C) 97.8 F (36.6 C)  TempSrc: Axillary Axillary    SpO2: 95% 96% 98% 98%  Weight:      Height:       General.  Frail and severely malnourished elderly man, in no acute distress. Pulmonary.  Lungs clear bilaterally, normal respiratory effort. CV.  Regular rate and rhythm, no JVD, rub or murmur. Abdomen.  Soft, nontender, nondistended, BS positive. CNS.  Alert and oriented to self.  No focal neurologic deficit. Extremities.  No edema, no cyanosis, pulses intact and  symmetrical.  Data Reviewed: Prior data reviewed  Family Communication: Talked with daughter on phone.  Disposition: Status is: Inpatient Remains inpatient appropriate because: Severity of illness  Planned Discharge Destination: Skilled nursing facility  DVT prophylaxis.  Subcu heparin  Time spent: 50 minutes  This record has been created using Conservation officer, historic buildings. Errors have been sought and corrected,but may not always be located. Such creation errors do not reflect on the standard of care.   Author: Amaryllis Dare, MD 09/04/2024 2:53  PM  For on call review [email or web address].

## 2024-09-04 NOTE — Assessment & Plan Note (Signed)
 BNP of 413 on admission, clinically appears dry.  Echocardiogram was normal.  Likely due to CKD and respiratory distress with influenza pneumonia. - Monitor volume status closely

## 2024-09-04 NOTE — Plan of Care (Signed)
  Problem: Education: Goal: Knowledge of General Education information will improve Description: Including pain rating scale, medication(s)/side effects and non-pharmacologic comfort measures Outcome: Progressing   Problem: Education: Goal: Knowledge of General Education information will improve Description: Including pain rating scale, medication(s)/side effects and non-pharmacologic comfort measures Outcome: Progressing   Problem: Health Behavior/Discharge Planning: Goal: Ability to manage health-related needs will improve Outcome: Progressing   Problem: Clinical Measurements: Goal: Ability to maintain clinical measurements within normal limits will improve Outcome: Progressing Goal: Diagnostic test results will improve Outcome: Progressing Goal: Respiratory complications will improve Outcome: Progressing Goal: Cardiovascular complication will be avoided Outcome: Progressing   Problem: Activity: Goal: Risk for activity intolerance will decrease Outcome: Progressing   Problem: Nutrition: Goal: Adequate nutrition will be maintained Outcome: Progressing   Problem: Elimination: Goal: Will not experience complications related to bowel motility Outcome: Progressing   Problem: Safety: Goal: Ability to remain free from injury will improve Outcome: Progressing   Problem: Skin Integrity: Goal: Risk for impaired skin integrity will decrease Outcome: Progressing

## 2024-09-04 NOTE — Progress Notes (Signed)
 [name] (Daughter)  called to get update, informed that patient still has poor appetite and request for some appetite stimulant.

## 2024-09-05 DIAGNOSIS — J1100 Influenza due to unidentified influenza virus with unspecified type of pneumonia: Secondary | ICD-10-CM | POA: Diagnosis not present

## 2024-09-05 DIAGNOSIS — G309 Alzheimer's disease, unspecified: Secondary | ICD-10-CM | POA: Diagnosis not present

## 2024-09-05 DIAGNOSIS — J9601 Acute respiratory failure with hypoxia: Secondary | ICD-10-CM | POA: Diagnosis not present

## 2024-09-05 DIAGNOSIS — E43 Unspecified severe protein-calorie malnutrition: Secondary | ICD-10-CM | POA: Diagnosis not present

## 2024-09-05 LAB — CBC
HCT: 21.3 % — ABNORMAL LOW (ref 39.0–52.0)
Hemoglobin: 7 g/dL — ABNORMAL LOW (ref 13.0–17.0)
MCH: 30.4 pg (ref 26.0–34.0)
MCHC: 32.9 g/dL (ref 30.0–36.0)
MCV: 92.6 fL (ref 80.0–100.0)
Platelets: 228 K/uL (ref 150–400)
RBC: 2.3 MIL/uL — ABNORMAL LOW (ref 4.22–5.81)
RDW: 16.9 % — ABNORMAL HIGH (ref 11.5–15.5)
WBC: 8.4 K/uL (ref 4.0–10.5)
nRBC: 0.2 % (ref 0.0–0.2)

## 2024-09-05 LAB — CREATININE, SERUM
Creatinine, Ser: 2.19 mg/dL — ABNORMAL HIGH (ref 0.61–1.24)
GFR, Estimated: 28 mL/min — ABNORMAL LOW (ref >60)

## 2024-09-05 NOTE — Plan of Care (Signed)
  Problem: Education: Goal: Knowledge of General Education information will improve Description: Including pain rating scale, medication(s)/side effects and non-pharmacologic comfort measures Outcome: Not Progressing   Problem: Education: Goal: Knowledge of General Education information will improve Description: Including pain rating scale, medication(s)/side effects and non-pharmacologic comfort measures Outcome: Not Progressing   Problem: Health Behavior/Discharge Planning: Goal: Ability to manage health-related needs will improve Outcome: Not Progressing   Problem: Clinical Measurements: Goal: Ability to maintain clinical measurements within normal limits will improve Outcome: Not Progressing   Problem: Activity: Goal: Risk for activity intolerance will decrease Outcome: Not Progressing   Problem: Nutrition: Goal: Adequate nutrition will be maintained Outcome: Not Progressing

## 2024-09-05 NOTE — Plan of Care (Signed)
  Problem: Education: Goal: Knowledge of General Education information will improve Description Including pain rating scale, medication(s)/side effects and non-pharmacologic comfort measures Outcome: Progressing   Problem: Health Behavior/Discharge Planning: Goal: Ability to manage health-related needs will improve Outcome: Progressing   Problem: Clinical Measurements: Goal: Respiratory complications will improve Outcome: Progressing Goal: Cardiovascular complication will be avoided Outcome: Progressing   Problem: Activity: Goal: Risk for activity intolerance will decrease Outcome: Progressing   Problem: Nutrition: Goal: Adequate nutrition will be maintained Outcome: Progressing   Problem: Coping: Goal: Level of anxiety will decrease Outcome: Progressing   Problem: Elimination: Goal: Will not experience complications related to bowel motility Outcome: Progressing

## 2024-09-05 NOTE — Progress Notes (Signed)
 Patient observed to be withdrawn, spending a majority of the time in bed with minimal interaction. Patient refusing all PO intake including food and meds despite encouragement. Patient demonstrates flat affect and limited verbal responses. Environment kept quiet.

## 2024-09-05 NOTE — Progress Notes (Signed)
 Progress Note   Patient: Adrian Michael FMW:[id number] DOB: 23-Jun-1935 DOA: 08/29/2024     7 DOS: the patient was seen and examined on 09/05/2024   Brief hospital course: Taken from prior notes.  Adrian Michael is a 89 y.o. male with medical history significant for hypertension, mixed Alzheimer's and vascular dementia, GERD, hypothyroidism, type II DM, CKD stage IIIb, gout, dyslipidemia, hyperparathyroidism, BPH, who presented to the hospital with generalized weakness, fever, poor appetite, productive cough and worsening shortness of breath.  Oxygen saturation was down into the 80s with EMS so he was placed on oxygen via nonrebreather mask and transported to the ED.  There is also report that he had tested positive for influenza on Wednesday, 08/25/2024.      Reportedly, patient had been living in [place] but was malnourished and had been recently admitted to the ICU.  He was discharged to a rehab facility where he spent several weeks.  His daughter decided to bring him to [facility]  (about 6 days prior to admission) so that she could take care of him.   Chest x-ray showed mild patchy airspace opacities in both lower lobes, concerning for pneumonia.  9/3: Hemodynamically stable, on 3 L of oxygen.  PT is recommending SNF.  Improving renal function.  Mild hypernatremia today with sodium of 148 and water  deficit of 1.7L Started on D5. Lengthy discussion with daughter, apparently patient is declining, he was becoming mostly bedbound since November 2024, few hospitalizations due to concern of severe dehydration and hypotension.  Refusing most of the p.o. intake and losing weight, since April 2025 he is spending most of his time in bed and sleeping.  Daughter wants him to go to [facility] memory care unit and is communicating with them.  TOC is aware Consulting palliative and dietitian.  Addendum.  Patient developed bigeminy with tachycardia, difficult to get underlying rhythm so cardiology was  consulted. Blood pressure was becoming soft, s/p 500 cc of bolus with some improvement.  Heart rate remained mostly in 140s.  Patient seems more lethargic, amiodarone  bolus followed by infusion was ordered after discussing with cardiology. Also ordered 1 unit of PRBC as ideal hemoglobin should be above 8 per cardiology. Patient is being transferred to stepdown, need to use pressors if needed as he already received quite a bit of fluid and developing some crackles.  9/4: Vital stable and converted back to sinus rhythm when seen today.  Little more alert.  Hemoglobin remained at 7.2 despite getting 2 unit of PRBC.  No worsening leukocytosis but procalcitonin at 4.02 and MRSA PCR positive-adding some vancomycin .  Cardiology continuing amiodarone  infusion.   9/5: Vital stable, hemoglobin with further worsening at 5, no obvious bleeding.  Smear with mixed RBC population and burr cells.  Checking Pavo virus, LDH normal,  CMP with some improvement in transaminitis but seems stable, T. bili normal.  Hematology was also consulted.  Checking FOBT and CT abdomen to rule out any occult bleeding.  9/6: Vital stable, on room air, hemoglobin at 6.8 after getting 2 more unit of PRBC yesterday.  Oncology ordered multiple myeloma panel, parvovirus labs are pending, likely bone marrow suppression secondary to viral infection.  Ordered 1 more unit of PRBC.  Amiodarone  infusion is being switched with p.o.  Completed the course of Zithromax  and ceftriaxone , will complete total of 5 days of vancomycin .  9/7: Vital stable, hemoglobin at 7.  Refusing most of the p.o. intake.  Remained in NSR  Assessment and Plan: * Influenzal pneumonia Blood  cultures remain negative and respiratory panel positive for influenza A. - Completed a 5-day course of ceftriaxone  and Zithromax   -Increasing procalcitonin so adding vancomycin  as MRSA PCR came back positive-will do for total of 5 days - Completed a course of Tamiflu    Acute  respiratory failure with hypoxia (HCC) Likely secondary to influenza pneumonia, no baseline oxygen use.  Initially required up to 15 L of oxygen,  Now weaned back to room air. - Continue supplemental oxygen as needed  Acute kidney injury superimposed on chronic kidney disease (HCC) Patient has an history of CKD stage IV. Renal functions seem stable with small variation around baseline, metabolic acidosis has been resolved.  Nephrology is on board.  -Monitor renal function -Avoid nephrotoxins  Cardiac arrhythmia Patient developed sudden onset tachycardia with variable underlying rhythm, intermittent concern of SVT, bigeminy, A-fib/flutter. Cardiology was consulted and patient was given amiodarone  bolus followed by infusion. Converted back to sinus rhythm and remained in NSR - Cardiology is on board - Amiodarone  infusion is being switched with p.o. now  Elevated troponin I level Likely demand ischemia.  Echocardiogram was normal. Cardiology was consulted-no need for any further investigation so cardiology signed off.  Essential hypertension Blood pressure currently within goal. -Amlodipine  was held due to softer blood pressure earlier. -Keep holding losartan  Hypernatremia Improved. -Monitor sodium Encourage hydration-  Anemia Hemoglobin at 7 s/p 5 unit of PRBC, 1 dose of IV iron  and 1 dose of Aranesp .  Anemia panel with anemia of chronic disease and some iron  deficiency.  B12 and folate was normal . Cardiology would like to keep hemoglobin above 8 -Smear with mixed population of RBC and burr cells -T. bili normal, LDH normal, haptoglobin pending -Pending parvovirus and MM labs -CT abdomen was negative for any occult bleeding or hematoma --Hematology consulted -Likely a bone marrow suppression secondary to viral infection  Elevated brain natriuretic peptide (BNP) level BNP of 413 on admission, clinically appears dry.  Echocardiogram was normal.  Likely due to CKD and  respiratory distress with influenza pneumonia. - Monitor volume status closely  Mixed Alzheimer's and vascular dementia (HCC) - Will continue Aricept  and Namenda  -Patient oriented to name at baseline  Protein-calorie malnutrition, severe Estimated body mass index is 21.13 kg/m as calculated from the following:   Height as of this encounter: 5' 7 (1.702 m).   Weight as of this encounter: 61.2 kg.   - Dietitian consult  Gout - Will continue allopurinol .  BPH (benign prostatic hyperplasia) - Will continue Flomax .  Pressure injury of skin - Frequent position change        Subjective: Patient was quite somnolent and refusing to talk when seen today.  He was refusing most of the p.o. intake per nursing staff.  Physical Exam: Vitals:   09/05/24 0006 09/05/24 0449 09/05/24 0835 09/05/24 1234  BP: 121/71 (!) 145/69 133/67 120/62  Pulse: 79 72 81 64  Resp: 19 18 18 18   Temp: 98.3 F (36.8 C) 97.9 F (36.6 C) 97.6 F (36.4 C) 97.9 F (36.6 C)  TempSrc:      SpO2: 96% 93% 96% 98%  Weight:      Height:       General.  Chronically ill-appearing and severely malnourished elderly man, in no acute distress. Pulmonary.  Lungs clear bilaterally, normal respiratory effort. CV.  Regular rate and rhythm, no JVD, rub or murmur. Abdomen.  Soft, nontender, nondistended, BS positive. CNS.  Alert and oriented .  No focal neurologic deficit. Extremities.  No  edema,  pulses intact and symmetrical.  Data Reviewed: Prior data reviewed  Family Communication: Discussed with daughter on phone.  Disposition: Status is: Inpatient Remains inpatient appropriate because: Severity of illness  Planned Discharge Destination: Skilled nursing facility  DVT prophylaxis.  Subcu heparin  Time spent: 50 minutes  This record has been created using Conservation officer, historic buildings. Errors have been sought and corrected,but may not always be located. Such creation errors do not reflect on the  standard of care.   Author: Amaryllis Dare, MD 09/05/2024 3:16 PM  For on call review [email or web address].

## 2024-09-06 DIAGNOSIS — J101 Influenza due to other identified influenza virus with other respiratory manifestations: Secondary | ICD-10-CM | POA: Diagnosis not present

## 2024-09-06 DIAGNOSIS — N184 Chronic kidney disease, stage 4 (severe): Secondary | ICD-10-CM | POA: Diagnosis not present

## 2024-09-06 DIAGNOSIS — N179 Acute kidney failure, unspecified: Secondary | ICD-10-CM | POA: Diagnosis not present

## 2024-09-06 DIAGNOSIS — I499 Cardiac arrhythmia, unspecified: Secondary | ICD-10-CM

## 2024-09-06 DIAGNOSIS — J9601 Acute respiratory failure with hypoxia: Secondary | ICD-10-CM | POA: Diagnosis not present

## 2024-09-06 LAB — RENAL FUNCTION PANEL
Albumin: 2.2 g/dL — ABNORMAL LOW (ref 3.5–5.0)
Anion gap: 6 (ref 5–15)
BUN: 81 mg/dL — ABNORMAL HIGH (ref 8–23)
CO2: 21 mmol/L — ABNORMAL LOW (ref 22–32)
Calcium: 8.3 mg/dL — ABNORMAL LOW (ref 8.9–10.3)
Chloride: 125 mmol/L — ABNORMAL HIGH (ref 98–111)
Creatinine, Ser: 2.31 mg/dL — ABNORMAL HIGH (ref 0.61–1.24)
GFR, Estimated: 27 mL/min — ABNORMAL LOW (ref >60)
Glucose, Bld: 103 mg/dL — ABNORMAL HIGH (ref 70–99)
Phosphorus: 3.7 mg/dL (ref 2.5–4.6)
Potassium: 4.9 mmol/L (ref 3.5–5.1)
Sodium: 152 mmol/L — ABNORMAL HIGH (ref 135–145)

## 2024-09-06 LAB — GLUCOSE, CAPILLARY: Glucose-Capillary: 88 mg/dL (ref 70–99)

## 2024-09-06 LAB — PROTEIN ELECTROPHORESIS, SERUM
A/G Ratio: 1.2 (ref 0.7–1.7)
Albumin ELP: 2.1 g/dL — ABNORMAL LOW (ref 2.9–4.4)
Alpha-1-Globulin: 0.1 g/dL (ref 0.0–0.4)
Alpha-2-Globulin: 0.7 g/dL (ref 0.4–1.0)
Beta Globulin: 0.6 g/dL — ABNORMAL LOW (ref 0.7–1.3)
Gamma Globulin: 0.4 g/dL (ref 0.4–1.8)
Globulin, Total: 1.7 g/dL — ABNORMAL LOW (ref 2.2–3.9)
Total Protein ELP: 3.8 g/dL — ABNORMAL LOW (ref 6.0–8.5)

## 2024-09-06 LAB — CBC
HCT: 21.5 % — ABNORMAL LOW (ref 39.0–52.0)
Hemoglobin: 6.7 g/dL — ABNORMAL LOW (ref 13.0–17.0)
MCH: 30.3 pg (ref 26.0–34.0)
MCHC: 31.2 g/dL (ref 30.0–36.0)
MCV: 97.3 fL (ref 80.0–100.0)
Platelets: 312 K/uL (ref 150–400)
RBC: 2.21 MIL/uL — ABNORMAL LOW (ref 4.22–5.81)
RDW: 17 % — ABNORMAL HIGH (ref 11.5–15.5)
WBC: 9.9 K/uL (ref 4.0–10.5)
nRBC: 0.2 % (ref 0.0–0.2)

## 2024-09-06 LAB — KAPPA/LAMBDA LIGHT CHAINS
Kappa free light chain: 59.9 mg/L — ABNORMAL HIGH (ref 3.3–19.4)
Kappa, lambda light chain ratio: 1.1 (ref 0.26–1.65)
Lambda free light chains: 54.3 mg/L — ABNORMAL HIGH (ref 5.7–26.3)

## 2024-09-06 MED ORDER — AMIODARONE HCL 200 MG PO TABS: ORAL_TABLET | ORAL | Status: AC

## 2024-09-06 MED ORDER — ENSURE PLUS HIGH PROTEIN PO LIQD: 237.0000 mL | Freq: Three times a day (TID) | ORAL | Status: DC

## 2024-09-06 MED ORDER — MIRTAZAPINE 15 MG PO TABS: 15.0000 mg | ORAL_TABLET | Freq: Every day | ORAL | Status: DC

## 2024-09-06 MED ORDER — HYDROCOD POLI-CHLORPHE POLI ER 10-8 MG/5ML PO SUER: 5.0000 mL | Freq: Two times a day (BID) | ORAL | Status: DC | PRN

## 2024-09-06 MED ORDER — GUAIFENESIN 100 MG/5ML PO LIQD: 15.0000 mL | Freq: Four times a day (QID) | ORAL | Status: DC

## 2024-09-06 MED ORDER — ZINC SULFATE 220 (50 ZN) MG PO CAPS: 220.0000 mg | ORAL_CAPSULE | Freq: Every day | ORAL | Status: DC

## 2024-09-06 MED ORDER — PROSOURCE PLUS PO LIQD: 30.0000 mL | Freq: Two times a day (BID) | ORAL | Status: DC

## 2024-09-06 MED ORDER — PANTOPRAZOLE SODIUM 40 MG IV SOLR
40.0000 mg | Freq: Two times a day (BID) | INTRAVENOUS | Status: DC
  Filled 2024-09-06: qty 10

## 2024-09-06 MED ORDER — VITAMIN A 3 MG (10000 UNIT) PO CAPS
10000.0000 [IU] | ORAL_CAPSULE | Freq: Every day | ORAL | Status: DC
  Filled 2024-09-06: qty 1

## 2024-09-06 MED ORDER — HYDROMORPHONE HCL 1 MG/ML IJ SOLN
0.5000 mg | INTRAMUSCULAR | Status: DC | PRN
  Administered 2024-09-06 (×2): 0.5 mg via INTRAVENOUS
  Filled 2024-09-06 (×2): qty 0.5

## 2024-09-06 MED ORDER — FE FUM-VIT C-VIT B12-FA 460-60-0.01-1 MG PO CAPS: 1.0000 | ORAL_CAPSULE | Freq: Two times a day (BID) | ORAL | Status: DC

## 2024-09-06 MED ORDER — IPRATROPIUM-ALBUTEROL 0.5-2.5 (3) MG/3ML IN SOLN: 3.0000 mL | RESPIRATORY_TRACT | Status: DC | PRN

## 2024-09-06 MED ORDER — DARBEPOETIN ALFA 40 MCG/0.4ML IJ SOSY
40.0000 ug | PREFILLED_SYRINGE | Freq: Once | INTRAMUSCULAR | Status: DC
  Filled 2024-09-06: qty 0.4

## 2024-09-06 MED ORDER — VITAMIN A 3 MG (10000 UNIT) PO CAPS: 10000.0000 [IU] | ORAL_CAPSULE | Freq: Every day | ORAL | Status: DC

## 2024-09-06 MED ORDER — SODIUM CHLORIDE 0.9% IV SOLUTION: Freq: Once | INTRAVENOUS | Status: DC

## 2024-09-06 NOTE — Plan of Care (Signed)

## 2024-09-06 NOTE — Progress Notes (Signed)
 Nutrition Follow-up  DOCUMENTATION CODES:   Severe malnutrition in context of chronic illness  INTERVENTION:   -Continue dysphagia 1 diet -Continue MVI with minerals daily -Continue Ensure Plus High Protein po TID, each supplement provides 350 kcal and 20 grams of protein  -Continue Magic cup TID with meals, each supplement provides 290 kcal and 9 grams of protein  -Continue 500 mg vitamin C  BID -Continue 220 mg zinc  sulfate daily x 14 days -Continue feeding assistance with meals -10,000 units vitamin A  x 60 days secondary to deficiency  NUTRITION DIAGNOSIS:   Severe Malnutrition related to chronic illness (dementia) as evidenced by moderate fat depletion, severe fat depletion, moderate muscle depletion, severe muscle depletion.  Ongoing  GOAL:   Patient will meet greater than or equal to 90% of their needs  Unmet  MONITOR:   PO intake, Supplement acceptance  REASON FOR ASSESSMENT:   Malnutrition Screening Tool, Consult Assessment of nutrition requirement/status  ASSESSMENT:   Pt with medical history significant for hypertension, mixed Alzheimer's and vascular dementia, GERD, hypothyroidism, type II DM, CKD stage IIIb, gout, dyslipidemia, hyperparathyroidism, BPH, who presented to the hospital with generalized weakness, fever, poor appetite, productive cough and worsening shortness of breath  Reviewed I/O's: -1.3 L x 24 hours and +4.3 L since admission  UOP: 1.3 L x 24 hours  Pt lying in bed at time of visit. Pt minimally interactive; did not arouse to voice or touch. No family present.  Pt with minimal oral intake. Per RN, pt has been refusing most oral intake over the past 48 hours.   Palliative care following for goals of care. Plan for DNR with full interventions.   Given pt's advanced age and dementia, would be hesitant to recommend artifical means of nutrition/ hydration.   No new wt since last visit.   Medications reviewed and include pacerone , vitamin  C, and zinc  sulfate.   Per TOC notes, reaching out to [facility] to determine eligibility.   Labs reviewed: Na: 152. Vitamin A : 20.0.   Diet Order:   Diet Order             DIET - DYS 1 Fluid consistency: Thin  Diet effective now                   EDUCATION NEEDS:   Not appropriate for education at this time  Skin:  Skin Assessment: Skin Integrity Issues: Skin Integrity Issues:: Stage III Stage III: sacrum  Last BM:  Unknown  Height:   Ht Readings from Last 1 Encounters:  08/29/24 5' 7 (1.702 m)    Weight:   Wt Readings from Last 1 Encounters:  08/29/24 61.2 kg    Ideal Body Weight:  67.3 kg  BMI:  Body mass index is 21.13 kg/m.  Estimated Nutritional Needs:   Kcal:  1800-2000  Protein:  100-115 grams  Fluid:  1.8-2.0 L    Margery ORN, RD, LDN, CDCES Registered Dietitian III Certified Diabetes Care and Education Specialist If unable to reach this RD, please use RD Inpatient group chat on secure chat between hours of 8am-4 pm daily

## 2024-09-06 NOTE — Progress Notes (Signed)
 [facility] Liaison Note   Received request from Transitions of Care Manger for family interest in [facility].  Eligibility has been confirmed.  Met/Spoke with patient and family to confirm interest and explain services.  Family agreeable to transfer today.  Transitions of Care manager aware.  RN please call report to [phone]  [facility]) prior to patient leaving the unit.  Please send signed and completed DNR with patient at discharge.  Thank you   Marinell Nova,  [facility] Liaison [phone]

## 2024-09-06 NOTE — Progress Notes (Signed)
 Patient will be transferred to hospice. Report given to Peggy at hospice - [phone]. They would like both peripheral IV access retained as they will utilize this after transport.

## 2024-09-06 NOTE — Discharge Summary (Signed)
 Physician Discharge Summary   Patient: Adrian Michael MRN: [id number] DOB: 11/18/1935  Admit date:     08/29/2024  Discharge date: 09/06/24  Discharge Physician: Amaryllis Dare   PCP: Godfrey Area, MD   Recommendations at discharge:  Patient is being discharged to hospice facility  Discharge Diagnoses: Principal Problem:   Influenzal pneumonia Active Problems:   Acute kidney injury superimposed on chronic kidney disease (HCC)   Acute respiratory failure with hypoxia (HCC)   Elevated troponin I level   Cardiac arrhythmia   Essential hypertension   Hypernatremia   Elevated brain natriuretic peptide (BNP) level   Anemia   Mixed Alzheimer's and vascular dementia (HCC)   Protein-calorie malnutrition, severe   Gout   BPH (benign prostatic hyperplasia)   Pressure injury of skin   Influenza A  Resolved Problems:   * No resolved hospital problems. [facility] Course: Taken from prior notes.  Adrian Michael is a 89 y.o. male with medical history significant for hypertension, mixed Alzheimer's and vascular dementia, GERD, hypothyroidism, type II DM, CKD stage IIIb, gout, dyslipidemia, hyperparathyroidism, BPH, who presented to the hospital with generalized weakness, fever, poor appetite, productive cough and worsening shortness of breath.  Oxygen saturation was down into the 80s with EMS so he was placed on oxygen via nonrebreather mask and transported to the ED.  There is also report that he had tested positive for influenza on Wednesday, 08/25/2024.      Reportedly, patient had been living in [place] but was malnourished and had been recently admitted to the ICU.  He was discharged to a rehab facility where he spent several weeks.  His daughter decided to bring him to [facility]  (about 6 days prior to admission) so that she could take care of him.   Chest x-ray showed mild patchy airspace opacities in both lower lobes, concerning for pneumonia.  9/3: Hemodynamically stable, on  3 L of oxygen.  PT is recommending SNF.  Improving renal function.  Mild hypernatremia today with sodium of 148 and water  deficit of 1.7L Started on D5. Lengthy discussion with daughter, apparently patient is declining, he was becoming mostly bedbound since November 2024, few hospitalizations due to concern of severe dehydration and hypotension.  Refusing most of the p.o. intake and losing weight, since April 2025 he is spending most of his time in bed and sleeping.  Daughter wants him to go to [place] memory care unit and is communicating with them.  TOC is aware Consulting palliative and dietitian.  Addendum.  Patient developed bigeminy with tachycardia, difficult to get underlying rhythm so cardiology was consulted. Blood pressure was becoming soft, s/p 500 cc of bolus with some improvement.  Heart rate remained mostly in 140s.  Patient seems more lethargic, amiodarone  bolus followed by infusion was ordered after discussing with cardiology. Also ordered 1 unit of PRBC as ideal hemoglobin should be above 8 per cardiology. Patient is being transferred to stepdown, need to use pressors if needed as he already received quite a bit of fluid and developing some crackles.  9/4: Vital stable and converted back to sinus rhythm when seen today.  Little more alert.  Hemoglobin remained at 7.2 despite getting 2 unit of PRBC.  No worsening leukocytosis but procalcitonin at 4.02 and MRSA PCR positive-adding some vancomycin .  Cardiology continuing amiodarone  infusion.   9/5: Vital stable, hemoglobin with further worsening at 5, no obvious bleeding.  Smear with mixed RBC population and burr cells.  Checking Pavo virus, LDH normal,  CMP  with some improvement in transaminitis but seems stable, T. bili normal.  Hematology was also consulted.  Checking FOBT and CT abdomen to rule out any occult bleeding.  9/6: Vital stable, on room air, hemoglobin at 6.8 after getting 2 more unit of PRBC yesterday.  Oncology ordered  multiple myeloma panel, parvovirus labs are pending, likely bone marrow suppression secondary to viral infection.  Ordered 1 more unit of PRBC.  Amiodarone  infusion is being switched with p.o.  Completed the course of Zithromax  and ceftriaxone , will complete total of 5 days of vancomycin .  9/7: Vital stable, hemoglobin at 7.  Refusing most of the p.o. intake.  Remained in NSR  9/8: Vitals stable, little more lethargic earlier in the morning, hemoglobin at 6.8 and sodium of 152.  Patient with significant failure to thrive and not taking much p.o.  Palliative care was consulted and had a lengthy discussion with daughter at bedside, she decided to proceed with full comfort care.  Patient was having significant uncontrolled back pain which seems chronic, becoming restless with pain, Dilaudid  as needed was helping.  Hospice was consulted and patient qualifies going to [facility] for end-of-life care.  Patient is being transferred to hospice home.  Assessment and Plan: * Influenzal pneumonia Blood cultures remain negative and respiratory panel positive for influenza A. - Completed a 5-day course of ceftriaxone  and Zithromax   -Increasing procalcitonin so adding vancomycin  as MRSA PCR came back positive-completed a 5-day course - Completed a course of Tamiflu    Acute respiratory failure with hypoxia (HCC) Likely secondary to influenza pneumonia, no baseline oxygen use.  Initially required up to 15 L of oxygen,  Now weaned back to room air. - Continue supplemental oxygen as needed  Acute kidney injury superimposed on chronic kidney disease (HCC) Patient has an history of CKD stage IV. Renal functions seem stable with small variation around baseline, metabolic acidosis has been resolved.  Nephrology is on board.  -Monitor renal function -Avoid nephrotoxins  Cardiac arrhythmia Patient developed sudden onset tachycardia with variable underlying rhythm, intermittent concern of SVT, bigeminy,  A-fib/flutter. Cardiology was consulted and patient was given amiodarone  bolus followed by infusion. Converted back to sinus rhythm and remained in NSR - Cardiology is on board - Amiodarone  infusion is being switched with p.o. now  Elevated troponin I level Likely demand ischemia.  Echocardiogram was normal. Cardiology was consulted-no need for any further investigation so cardiology signed off.  Essential hypertension Blood pressure currently within goal. Holding home amlodipine  and losartan  Hypernatremia Worsening sodium at 152 today after initial improvement  Anemia Hemoglobin at 6.7 s/p 5 unit of PRBC, 1 dose of IV iron  and 1 dose of Aranesp .  Anemia panel with anemia of chronic disease and some iron  deficiency.  B12 and folate was normal . Cardiology would like to keep hemoglobin above 8 -Smear with mixed population of RBC and burr cells -T. bili normal, LDH normal, haptoglobin pending -Pending parvovirus and MM labs -CT abdomen was negative for any occult bleeding or hematoma --Hematology consulted -Likely a bone marrow suppression secondary to viral infection  Elevated brain natriuretic peptide (BNP) level BNP of 413 on admission, clinically appears dry.  Echocardiogram was normal.  Likely due to CKD and respiratory distress with influenza pneumonia. - Monitor volume status closely  Mixed Alzheimer's and vascular dementia (HCC) - Will continue Aricept  and Namenda  -Patient oriented to name at baseline  Protein-calorie malnutrition, severe Estimated body mass index is 21.13 kg/m as calculated from the following:   Height  as of this encounter: 5' 7 (1.702 m).   Weight as of this encounter: 61.2 kg.   - Dietitian consult  Gout - Will continue allopurinol .  BPH (benign prostatic hyperplasia) - Will continue Flomax .  Pressure injury of skin - Frequent position change       Consultants: Cardiology.  Palliative care Procedures performed: None Disposition:  Hospice care Diet recommendation:  Discharge Diet Orders (From admission, onward)     Start     Ordered   09/06/24 0000  Diet - low sodium heart healthy        09/06/24 1429           Regular diet DISCHARGE MEDICATION: Allergies as of 09/06/2024   No Known Allergies      Medication List     STOP taking these medications    amLODipine  5 MG tablet Commonly known as: NORVASC    atenolol 25 MG tablet Commonly known as: TENORMIN   CANNABIDIOL PO   Ginkgo Biloba 40 MG Tabs   losartan 100 MG tablet Commonly known as: COZAAR   OSTEO BI-FLEX TRIPLE STRENGTH PO   simvastatin  40 MG tablet Commonly known as: ZOCOR        TAKE these medications    feeding supplement Liqd Take 237 mLs by mouth 3 (three) times daily between meals.   (feeding supplement) PROSource Plus liquid Take 30 mLs by mouth 2 (two) times daily between meals. Start taking on: September 07, 2024   allopurinol  100 MG tablet Commonly known as: ZYLOPRIM  Take 100 mg by mouth daily.   amiodarone  200 MG tablet Commonly known as: PACERONE  Take 2 tablets (400 mg total) by mouth 2 (two) times daily for 1 day, THEN 1 tablet (200 mg total) 2 (two) times daily for 5 days, THEN 1 tablet (200 mg total) daily. Start taking on: September 06, 2024   Centrum Silver 50+Men Tabs Take by mouth.   chlorpheniramine-HYDROcodone 10-8 MG/5ML Commonly known as: TUSSIONEX Take 5 mLs by mouth every 12 (twelve) hours as needed for cough.   donepezil  10 MG tablet Commonly known as: ARICEPT  Take 10 mg by mouth daily.   Fe Fum-Vit C-Vit B12-FA Caps capsule Commonly known as: TRIGELS-F FORTE Take 1 capsule by mouth 2 (two) times daily.   guaiFENesin  100 MG/5ML liquid Commonly known as: ROBITUSSIN Take 15 mLs by mouth every 6 (six) hours.   ipratropium-albuterol  0.5-2.5 (3) MG/3ML Soln Commonly known as: DUONEB Take 3 mLs by nebulization every 4 (four) hours as needed.   levothyroxine  50 MCG tablet Commonly known  as: SYNTHROID  Take 50 mcg by mouth daily.   memantine  5 MG tablet Commonly known as: NAMENDA  Take by mouth.   metFORMIN 750 MG 24 hr tablet Commonly known as: GLUCOPHAGE-XR Take 750 mg by mouth every evening. With dinner   mirtazapine  15 MG tablet Commonly known as: REMERON  Take 1 tablet (15 mg total) by mouth at bedtime. What changed:  medication strength how much to take   tamsulosin  0.4 MG Caps capsule Commonly known as: FLOMAX  Take 0.4 mg by mouth.   triamcinolone cream 0.1 % Commonly known as: KENALOG Apply 1 application topically 2 (two) times daily.   vitamin A  3 MG (10000 UNITS) capsule Take 1 capsule (10,000 Units total) by mouth daily. Start taking on: September 07, 2024   Vitamin D-3 125 MCG (5000 UT) Tabs Take by mouth.   zinc  sulfate (50mg  elemental zinc ) 220 (50 Zn) MG capsule Take 1 capsule (220 mg total) by mouth daily. Start taking  on: September 07, 2024               Discharge Care Instructions  (From admission, onward)           Start     Ordered   09/06/24 0000  Discharge wound care:       Comments: Cleanse sacral wound with NS, using a Q tip applicator insert a small piece of silver hydrofiber Soila [id number] [employer]) into wound bed daily, cover with silicone foam   09/06/24 1429            Follow-up Information     Custovic, Sabina, DO. Go in 1 week(s).   Specialty: Cardiology Contact information: [address] [place] [address] [phone]                Discharge Exam: Fredricka Weights   08/29/24 1805 08/29/24 2112  Weight: 61.2 kg 61.2 kg   General.  Frail and severely malnourished elderly man, in no acute distress. Pulmonary.  Little coarse breath sounds bilaterally, normal respiratory effort. CV.  Regular rate and rhythm, no JVD, rub or murmur. Abdomen.  Soft, nontender, nondistended, BS positive. CNS.  Alert and oriented to self.  No focal neurologic deficit. Extremities.  No edema, no  cyanosis, pulses intact and symmetrical.  Condition at discharge: stable  The results of significant diagnostics from this hospitalization (including imaging, microbiology, ancillary and laboratory) are listed below for reference.   Imaging Studies: CT ABDOMEN PELVIS WO CONTRAST Result Date: 09/03/2024 CLINICAL DATA:  Retroperitoneal bleed suspected EXAM: CT ABDOMEN AND PELVIS WITHOUT CONTRAST TECHNIQUE: Multidetector CT imaging of the abdomen and pelvis was performed following the standard protocol without IV contrast. RADIATION DOSE REDUCTION: This exam was performed according to the departmental dose-optimization program which includes automated exposure control, adjustment of the mA and/or kV according to patient size and/or use of iterative reconstruction technique. COMPARISON:  None Available. FINDINGS: Lower chest: Bilateral lower lobe airspace disease. Trace bilateral pleural effusions. Scattered coronary artery and aortic atherosclerosis. Hepatobiliary: No focal hepatic abnormality. Gallbladder unremarkable. Pancreas: No focal abnormality or ductal dilatation. Spleen: No focal abnormality.  Normal size. Adrenals/Urinary Tract: No suspicious renal or adrenal lesion. No hydronephrosis. Urinary bladder unremarkable. Stomach/Bowel: Stomach, large and small bowel grossly unremarkable. Vascular/Lymphatic: Aortic atherosclerosis. No evidence of aneurysm or adenopathy. Reproductive: No visible focal abnormality. Other: No free fluid or free air.  No retroperitoneal hematoma. Musculoskeletal: No acute bony abnormality. Diffuse degenerative disc and facet disease throughout the lumbar spine. IMPRESSION: No acute findings in the abdomen or pelvis. No evidence of retroperitoneal hematoma. Bilateral lower lobe airspace opacities with trace effusions. This could reflect atelectasis or pneumonia. Coronary artery disease, aortic atherosclerosis. Electronically Signed   By: Franky Crease M.D.   On: 09/03/2024 17:15    ECHOCARDIOGRAM COMPLETE Result Date: 08/30/2024    ECHOCARDIOGRAM REPORT   Patient Name:   BRONCO MCGRORY Date of Exam: 08/30/2024 Medical Rec #:  [id number]   Height:       67.0 in Accession #:    [id number]  Weight:       134.9 lb Date of Birth:  1935/08/27   BSA:          1.711 m Patient Age:    89 years    BP:           134/58 mmHg Patient Gender: M           HR:           73  bpm. Exam Location:  [facility] Procedure: 2D Echo, Cardiac Doppler, Color Doppler and Strain Analysis (Both            Spectral and Color Flow Doppler were utilized during procedure). Indications:     Elevated Troponin  History:         Patient has no prior history of Echocardiogram examinations.  Sonographer:     Thedora Louder RDCS, FASE Referring Phys:  [id number] MADISON A MANSY Diagnosing Phys: Denyse Bathe  Sonographer Comments: Global longitudinal strain was attempted. Challenging study due to patient's low acoustic windows. IMPRESSIONS  1. Left ventricular ejection fraction, by estimation, is 60 to 65%. The left ventricle has normal function. The left ventricle has no regional wall motion abnormalities. Left ventricular diastolic parameters were normal. The global longitudinal strain is normal.  2. Right ventricular systolic function is normal. The right ventricular size is normal.  3. The mitral valve is normal in structure. Mild mitral valve regurgitation. No evidence of mitral stenosis.  4. The aortic valve is normal in structure. Aortic valve regurgitation is mild to moderate. Aortic valve sclerosis/calcification is present, without any evidence of aortic stenosis.  5. The inferior vena cava is normal in size with greater than 50% respiratory variability, suggesting right atrial pressure of 3 mmHg. FINDINGS  Left Ventricle: Left ventricular ejection fraction, by estimation, is 60 to 65%. The left ventricle has normal function. The left ventricle has no regional wall motion abnormalities. Strain was performed and the global longitudinal  strain is normal. The  left ventricular internal cavity size was normal in size. There is no left ventricular hypertrophy. Left ventricular diastolic parameters were normal. Right Ventricle: The right ventricular size is normal. No increase in right ventricular wall thickness. Right ventricular systolic function is normal. Left Atrium: Left atrial size was normal in size. Right Atrium: Right atrial size was normal in size. Pericardium: There is no evidence of pericardial effusion. Mitral Valve: The mitral valve is normal in structure. Mild mitral valve regurgitation. No evidence of mitral valve stenosis. Tricuspid Valve: The tricuspid valve is normal in structure. Tricuspid valve regurgitation is trivial. No evidence of tricuspid stenosis. Aortic Valve: The aortic valve is normal in structure. Aortic valve regurgitation is mild to moderate. Aortic regurgitation PHT measures 810 msec. Aortic valve sclerosis/calcification is present, without any evidence of aortic stenosis. Aortic valve peak  gradient measures 5.7 mmHg. Pulmonic Valve: The pulmonic valve was normal in structure. Pulmonic valve regurgitation is not visualized. No evidence of pulmonic stenosis. Aorta: The aortic root is normal in size and structure. Venous: The inferior vena cava is normal in size with greater than 50% respiratory variability, suggesting right atrial pressure of 3 mmHg. IAS/Shunts: No atrial level shunt detected by color flow Doppler. Additional Comments: 3D was performed not requiring image post processing on an independent workstation and was indeterminate.  LEFT VENTRICLE PLAX 2D LVIDd:         4.40 cm   Diastology LVIDs:         3.10 cm   LV e' medial:    6.85 cm/s LV PW:         1.30 cm   LV E/e' medial:  8.7 LV IVS:        1.20 cm   LV e' lateral:   6.74 cm/s LVOT diam:     2.40 cm   LV E/e' lateral: 8.9 LV SV:         95 LV SV Index:   55 LVOT Area:  4.52 cm  RIGHT VENTRICLE RV Basal diam:  3.70 cm RV S prime:     14.80 cm/s  TAPSE (M-mode): 2.0 cm LEFT ATRIUM             Index        RIGHT ATRIUM           Index LA diam:        3.40 cm 1.99 cm/m   RA Area:     16.80 cm LA Vol (A2C):   36.7 ml 21.45 ml/m  RA Volume:   45.00 ml  26.30 ml/m LA Vol (A4C):   27.6 ml 16.13 ml/m LA Biplane Vol: 32.1 ml 18.76 ml/m  AORTIC VALVE                 PULMONIC VALVE AV Area (Vmax): 3.34 cm     PV Vmax:          1.15 m/s AV Vmax:        119.00 cm/s  PV Peak grad:     5.3 mmHg AV Peak Grad:   5.7 mmHg     PR End Diast Vel: 5.57 msec LVOT Vmax:      87.80 cm/s   RVOT Peak grad:   3 mmHg LVOT Vmean:     61.200 cm/s LVOT VTI:       0.209 m AI PHT:         810 msec  AORTA Ao Root diam: 3.70 cm MITRAL VALVE               TRICUSPID VALVE MV Area (PHT): 3.08 cm    TR Peak grad:   30.2 mmHg MV Decel Time: 246 msec    TR Vmax:        275.00 cm/s MV E velocity: 59.70 cm/s MV A velocity: 59.70 cm/s  SHUNTS MV E/A ratio:  1.00        Systemic VTI:  0.21 m                            Systemic Diam: 2.40 cm [employer] Electronically signed by Denyse Bathe Signature Date/Time: 08/30/2024/10:59:50 AM    Final    CT Head Wo Contrast Result Date: 08/29/2024 CLINICAL DATA:  Mental status change, unknown cause EXAM: CT HEAD WITHOUT CONTRAST TECHNIQUE: Contiguous axial images were obtained from the base of the skull through the vertex without intravenous contrast. RADIATION DOSE REDUCTION: This exam was performed according to the departmental dose-optimization program which includes automated exposure control, adjustment of the mA and/or kV according to patient size and/or use of iterative reconstruction technique. COMPARISON:  None Available. FINDINGS: Brain: There is periventricular white matter decreased attenuation consistent with small vessel ischemic changes. Ventricles, sulci and cisterns are prominent consistent with age related involutional changes. No acute intracranial hemorrhage, mass effect or shift. No hydrocephalus. Vascular: No hyperdense vessel or  unexpected calcification. Skull: Normal. Negative for fracture or focal lesion. Sinuses/Orbits: No acute finding. IMPRESSION: Atrophy and chronic small vessel ischemic changes. No acute intracranial process identified. Electronically Signed   By: Fonda Field M.D.   On: 08/29/2024 18:39   DG Chest Port 1 View Result Date: 08/29/2024 CLINICAL DATA:  Shortness of breath EXAM: PORTABLE CHEST 1 VIEW COMPARISON:  None Available. FINDINGS: There are mild patchy airspace opacities in both lower lobes. There is no pleural effusion or pneumothorax. Cardiomediastinal silhouette demonstrates mild cardiomegaly. There is no pneumothorax or acute fracture. IMPRESSION:  Mild patchy airspace opacities in both lower lobes, concerning for pneumonia. Follow-up PA and lateral chest x-ray recommended in 4-6 weeks to confirm resolution. Electronically Signed   By: Greig Pique M.D.   On: 08/29/2024 18:28    Microbiology: Results for orders placed or performed during the hospital encounter of 08/29/24  Culture, blood (routine x 2)     Status: None   Collection Time: 08/29/24  6:00 PM   Specimen: BLOOD  Result Value Ref Range Status   Specimen Description BLOOD BLOOD LEFT FOREARM  Final   Special Requests   Final    BOTTLES DRAWN AEROBIC AND ANAEROBIC Blood Culture results may not be optimal due to an inadequate volume of blood received in culture bottles   Culture   Final    NO GROWTH 5 DAYS Performed at [facility], [address] [place], [place] [address]    Report Status 09/03/2024 FINAL  Final  Culture, blood (routine x 2)     Status: None   Collection Time: 08/29/24  6:00 PM   Specimen: BLOOD  Result Value Ref Range Status   Specimen Description BLOOD BLOOD RIGHT FOREARM  Final   Special Requests   Final    BOTTLES DRAWN AEROBIC AND ANAEROBIC Blood Culture results may not be optimal due to an inadequate volume of blood received in culture bottles   Culture   Final    NO GROWTH 5  DAYS Performed at [facility], [address] [place], [place] [address]    Report Status 09/03/2024 FINAL  Final  Resp panel by RT-PCR (RSV, Flu A&B, Covid) Anterior Nasal Swab     Status: Abnormal   Collection Time: 08/29/24  6:01 PM   Specimen: Anterior Nasal Swab  Result Value Ref Range Status   SARS Coronavirus 2 by RT PCR NEGATIVE NEGATIVE Final    Comment: (NOTE) SARS-CoV-2 target nucleic acids are NOT DETECTED.  The SARS-CoV-2 RNA is generally detectable in upper respiratory specimens during the acute phase of infection. The lowest concentration of SARS-CoV-2 viral copies this assay can detect is 138 copies/mL. A negative result does not preclude SARS-Cov-2 infection and should not be used as the sole basis for treatment or other patient management decisions. A negative result may occur with  improper specimen collection/handling, submission of specimen other than nasopharyngeal swab, presence of viral mutation(s) within the areas targeted by this assay, and inadequate number of viral copies(<138 copies/mL). A negative result must be combined with clinical observations, patient history, and epidemiological information. The expected result is Negative.  Fact Sheet for Patients:  BloggerCourse.com  Fact Sheet for Healthcare Providers:  SeriousBroker.it  This test is no t yet approved or cleared by the [place]  FDA and  has been authorized for detection and/or diagnosis of SARS-CoV-2 by FDA under an Emergency Use Authorization (EUA). This EUA will remain  in effect (meaning this test can be used) for the duration of the COVID-19 declaration under Section 564(b)(1) of the Act, 21 U.S.C.section 360bbb-3(b)(1), unless the authorization is terminated  or revoked sooner.       Influenza A by PCR POSITIVE (A) NEGATIVE Final   Influenza B by PCR NEGATIVE NEGATIVE Final    Comment: (NOTE) The Xpert Xpress  SARS-CoV-2/FLU/RSV plus assay is intended as an aid in the diagnosis of influenza from Nasopharyngeal swab specimens and should not be used as a sole basis for treatment. Nasal washings and aspirates are unacceptable for Xpert Xpress SARS-CoV-2/FLU/RSV testing.  Fact Sheet  for Patients: BloggerCourse.com  Fact Sheet for Healthcare Providers: SeriousBroker.it  This test is not yet approved or cleared by the [place]  FDA and has been authorized for detection and/or diagnosis of SARS-CoV-2 by FDA under an Emergency Use Authorization (EUA). This EUA will remain in effect (meaning this test can be used) for the duration of the COVID-19 declaration under Section 564(b)(1) of the Act, 21 U.S.C. section 360bbb-3(b)(1), unless the authorization is terminated or revoked.     Resp Syncytial Virus by PCR NEGATIVE NEGATIVE Final    Comment: (NOTE) Fact Sheet for Patients: BloggerCourse.com  Fact Sheet for Healthcare Providers: [employer]  This test is not yet approved or cleared by the [place]  FDA and has been authorized for detection and/or diagnosis of SARS-CoV-2 by FDA under an Emergency Use Authorization (EUA). This EUA will remain in effect (meaning this test can be used) for the duration of the COVID-19 declaration under Section 564(b)(1) of the Act, 21 U.S.C. section 360bbb-3(b)(1), unless the authorization is terminated or revoked.  Performed at [facility], [address] [place], [place] [address]   MRSA Next Gen by PCR, Nasal     Status: Abnormal   Collection Time: 09/01/24  6:49 PM   Specimen: Nasal Mucosa; Nasal Swab  Result Value Ref Range Status   MRSA by PCR Next Gen DETECTED (A) NOT DETECTED Final    Comment: RESULT CALLED TO, READ BACK BY AND VERIFIED WITH:  CATHERINE WILSON AT 2237 09/01/24 JG (NOTE) The GeneXpert MRSA Assay (FDA approved  for NASAL specimens only), is one component of a comprehensive MRSA colonization surveillance program. It is not intended to diagnose MRSA infection nor to guide or monitor treatment for MRSA infections. Test performance is not FDA approved in patients less than 76 years old. Performed at [facility], [address] [place], [place] [address]     Labs: CBC: Recent Labs  Lab 09/03/24 [phone] 09/03/24 0950 09/04/24 0520 09/05/24 0504 09/06/24 0409  WBC 6.7 6.9 7.7 8.4 9.9  NEUTROABS  --  3.3  --   --   --   HGB 5.0* 5.5* 6.8* 7.0* 6.7*  HCT 15.2* 17.4* 20.5* 21.3* 21.5*  MCV 95.0 96.1 91.5 92.6 97.3  PLT 139* 150 166 228 312   Basic Metabolic Panel: Recent Labs  Lab 08/31/24 0532 09/01/24 0552 09/02/24 0214 09/03/24 0437 09/03/24 0950 09/05/24 0504 09/06/24 0409  NA 143 148* 149*  --  142  --  152*  K 4.1 4.1 3.9  --  3.4*  --  4.9  CL 108 111 112*  --  108  --  125*  CO2 25 27 26   --  20*  --  21*  GLUCOSE 100* 89 118*  --  110*  --  103*  BUN 122* 126* 118*  --  113*  --  81*  CREATININE 2.18* 2.01* 2.03* 2.14* 2.14* 2.19* 2.31*  CALCIUM  8.1* 8.1* 8.2*  --  7.7*  --  8.3*  MG 2.3  --   --   --   --   --   --   PHOS  --   --  4.2  --   --   --  3.7   Liver Function Tests: Recent Labs  Lab 08/31/24 0532 09/02/24 0214 09/03/24 0950 09/06/24 0409  AST 185*  --  119*  --   ALT 111*  --  117*  --   ALKPHOS 79  --  76  --  BILITOT 0.6  --  0.4  --   PROT 4.6*  --  4.1*  --   ALBUMIN 2.2* 2.0* 1.9* 2.2*   CBG: Recent Labs  Lab 09/01/24 1848 09/06/24 0849  GLUCAP 112* 88    Discharge time spent: greater than 30 minutes.  This record has been created using Conservation officer, historic buildings. Errors have been sought and corrected,but may not always be located. Such creation errors do not reflect on the standard of care.   Signed: Amaryllis Dare, MD [facility] 09/06/2024

## 2024-09-06 NOTE — TOC Progression Note (Addendum)
 Transition of Care [facility]) - Progression Note    Patient Details  Name: Adrian Michael MRN: [id number] Date of Birth: 1935-08-02  Transition [facility]) CM/SW Contact  Lauraine JAYSON Carpen, LCSW Phone Number: 09/06/2024, 10:50 AM  Clinical Narrative:  TOC continues to follow progress.   11:27 am: Per MD, daughter has decided to pursue hospice. CSW met with patient and daughter and confirmed. First preference is the [facility] in [place]. If they cannot accept, she wants to see if [facility] can accept him with hospice. Gave referral to [place] with [facility].  Expected Discharge Plan:  (TBD) Barriers to Discharge: Continued Medical Work up               Expected Discharge Plan and Services     Post Acute Care Choice:  (TBD) Living arrangements for the past 2 months: Single Family Home                                       Social Drivers of Health (SDOH) Interventions SDOH Screenings   Food Insecurity: No Food Insecurity (08/30/2024)  Housing: Low Risk  (08/30/2024)  Transportation Needs: No Transportation Needs (08/30/2024)  Utilities: Not At Risk (08/30/2024)  Social Connections: Patient Unable To Answer (08/30/2024)  Tobacco Use: Low Risk  (06/05/2021)   Received from [facility]    Readmission Risk Interventions     No data to display

## 2024-09-06 NOTE — TOC Transition Note (Signed)
 Transition of Care [facility]) - Discharge Note   Patient Details  Name: Adrian Michael MRN: [id number] Date of Birth: [birth year]  Transition of Care [facility]) CM/SW Contact:  Lauraine JAYSON Carpen, LCSW Phone Number: 09/06/2024, 2:48 PM   Clinical Narrative:   Patient has orders to discharge to the [facility] today. RN has already called report. [facility] liaison will set up transport once consents are signed. Transport paperwork is in the discharge packet as well as a DNR that MD is aware needs to be signed. No further concerns. CSW signing off.  Final next level of care: Hospice Medical Facility Barriers to Discharge: Barriers Resolved   Patient Goals and CMS Choice            Discharge Placement                Patient to be transferred to facility by: [facility]   Patient and family notified of of transfer: 09/06/24  Discharge Plan and Services Additional resources added to the After Visit Summary for       Post Acute Care Choice:  (TBD)                               Social Drivers of Health (SDOH) Interventions SDOH Screenings   Food Insecurity: No Food Insecurity (08/30/2024)  Housing: Low Risk  (08/30/2024)  Transportation Needs: No Transportation Needs (08/30/2024)  Utilities: Not At Risk (08/30/2024)  Social Connections: Patient Unable To Answer (08/30/2024)  Tobacco Use: Low Risk  (06/05/2021)   Received from [facility]     Readmission Risk Interventions     No data to display

## 2024-09-07 LAB — TYPE AND SCREEN
ABO/RH(D): O POS
Antibody Screen: NEGATIVE
Unit division: 0
Unit division: 0

## 2024-09-07 LAB — BPAM RBC
Blood Product Expiration Date: 202510102359
Blood Product Expiration Date: 202510082359
ISSUE DATE / TIME: 202509061421
Unit Type and Rh: 5100
Unit Type and Rh: 5100

## 2024-09-07 LAB — HAPTOGLOBIN: Haptoglobin: 250 mg/dL (ref 38–329)

## 2024-09-07 LAB — HUMAN PARVOVIRUS DNA DETECTION BY PCR: Parvovirus B19, PCR: NEGATIVE

## 2024-09-07 LAB — PREPARE RBC (CROSSMATCH)

## 2024-09-29 DEATH — deceased
